# Patient Record
Sex: Female | Born: 1965 | Race: Black or African American | Hispanic: No | Marital: Married | Smoking: Never smoker
Health system: Southern US, Community
[De-identification: ages and names within clinical notes are randomized; demographics above are authoritative.]

## PROBLEM LIST (undated history)

## (undated) DIAGNOSIS — D649 Anemia, unspecified: Secondary | ICD-10-CM

## (undated) DIAGNOSIS — O24419 Gestational diabetes mellitus in pregnancy, unspecified control: Secondary | ICD-10-CM

## (undated) DIAGNOSIS — E785 Hyperlipidemia, unspecified: Secondary | ICD-10-CM

## (undated) DIAGNOSIS — F419 Anxiety disorder, unspecified: Secondary | ICD-10-CM

## (undated) HISTORY — PX: CHOLECYSTECTOMY: SHX55

## (undated) HISTORY — DX: Anxiety disorder, unspecified: F41.9

## (undated) HISTORY — DX: Anemia, unspecified: D64.9

## (undated) HISTORY — PX: OTHER SURGICAL HISTORY: SHX169

---

## 2004-04-16 ENCOUNTER — Ambulatory Visit: Payer: Self-pay | Admitting: Family Medicine

## 2005-01-07 ENCOUNTER — Other Ambulatory Visit: Admission: RE | Admit: 2005-01-07 | Discharge: 2005-01-07 | Payer: Self-pay | Admitting: Family Medicine

## 2007-09-26 ENCOUNTER — Emergency Department (HOSPITAL_COMMUNITY): Admission: EM | Admit: 2007-09-26 | Discharge: 2007-09-26 | Payer: Self-pay | Admitting: Emergency Medicine

## 2009-08-29 ENCOUNTER — Emergency Department (HOSPITAL_COMMUNITY): Admission: EM | Admit: 2009-08-29 | Discharge: 2009-08-29 | Payer: Self-pay | Admitting: Emergency Medicine

## 2009-09-22 ENCOUNTER — Ambulatory Visit (HOSPITAL_COMMUNITY): Admission: RE | Admit: 2009-09-22 | Discharge: 2009-09-22 | Payer: Self-pay | Admitting: Family Medicine

## 2009-10-27 ENCOUNTER — Other Ambulatory Visit: Admission: RE | Admit: 2009-10-27 | Discharge: 2009-10-27 | Payer: Self-pay | Admitting: Family Medicine

## 2010-06-18 LAB — URINE CULTURE: Culture: NO GROWTH

## 2010-06-18 LAB — GLUCOSE, CAPILLARY: Glucose-Capillary: 113 mg/dL — ABNORMAL HIGH (ref 70–99)

## 2010-06-18 LAB — POCT URINALYSIS DIP (DEVICE)
Bilirubin Urine: NEGATIVE
Glucose, UA: NEGATIVE mg/dL
Ketones, ur: NEGATIVE mg/dL

## 2011-07-02 ENCOUNTER — Other Ambulatory Visit: Payer: Self-pay | Admitting: Family Medicine

## 2011-07-02 ENCOUNTER — Ambulatory Visit
Admission: RE | Admit: 2011-07-02 | Discharge: 2011-07-02 | Disposition: A | Discharge status: Home / Self Care | Payer: Federal, State, Local not specified - PPO | Source: Ambulatory Visit | Attending: Family Medicine | Admitting: Family Medicine

## 2011-07-02 DIAGNOSIS — N63 Unspecified lump in unspecified breast: Secondary | ICD-10-CM

## 2015-01-09 LAB — HM PAP SMEAR: HM PAP: NEGATIVE

## 2015-02-04 ENCOUNTER — Other Ambulatory Visit: Payer: Self-pay | Admitting: Internal Medicine

## 2015-02-04 DIAGNOSIS — Z1231 Encounter for screening mammogram for malignant neoplasm of breast: Secondary | ICD-10-CM

## 2015-08-29 DIAGNOSIS — E785 Hyperlipidemia, unspecified: Secondary | ICD-10-CM | POA: Diagnosis not present

## 2015-08-29 DIAGNOSIS — E663 Overweight: Secondary | ICD-10-CM | POA: Diagnosis not present

## 2015-08-29 DIAGNOSIS — D509 Iron deficiency anemia, unspecified: Secondary | ICD-10-CM | POA: Diagnosis not present

## 2015-10-24 DIAGNOSIS — K080 Exfoliation of teeth due to systemic causes: Secondary | ICD-10-CM | POA: Diagnosis not present

## 2015-10-30 DIAGNOSIS — E663 Overweight: Secondary | ICD-10-CM | POA: Diagnosis not present

## 2015-10-30 DIAGNOSIS — E785 Hyperlipidemia, unspecified: Secondary | ICD-10-CM | POA: Diagnosis not present

## 2015-11-21 ENCOUNTER — Emergency Department (HOSPITAL_COMMUNITY)
Admission: EM | Admit: 2015-11-21 | Discharge: 2015-11-22 | Disposition: A | Discharge status: Home / Self Care | Payer: Federal, State, Local not specified - PPO | Attending: Emergency Medicine | Admitting: Emergency Medicine

## 2015-11-21 ENCOUNTER — Emergency Department (HOSPITAL_COMMUNITY): Payer: Federal, State, Local not specified - PPO

## 2015-11-21 ENCOUNTER — Encounter (HOSPITAL_COMMUNITY): Payer: Self-pay | Admitting: Emergency Medicine

## 2015-11-21 DIAGNOSIS — R0789 Other chest pain: Secondary | ICD-10-CM | POA: Insufficient documentation

## 2015-11-21 DIAGNOSIS — R079 Chest pain, unspecified: Secondary | ICD-10-CM

## 2015-11-21 DIAGNOSIS — R0602 Shortness of breath: Secondary | ICD-10-CM | POA: Diagnosis not present

## 2015-11-21 HISTORY — DX: Hyperlipidemia, unspecified: E78.5

## 2015-11-21 HISTORY — DX: Gestational diabetes mellitus in pregnancy, unspecified control: O24.419

## 2015-11-21 LAB — BASIC METABOLIC PANEL
ANION GAP: 9 (ref 5–15)
BUN: 11 mg/dL (ref 6–20)
CALCIUM: 9.4 mg/dL (ref 8.9–10.3)
CO2: 18 mmol/L — ABNORMAL LOW (ref 22–32)
Chloride: 110 mmol/L (ref 101–111)
Creatinine, Ser: 0.88 mg/dL (ref 0.44–1.00)
Glucose, Bld: 128 mg/dL — ABNORMAL HIGH (ref 65–99)
Potassium: 3.1 mmol/L — ABNORMAL LOW (ref 3.5–5.1)
SODIUM: 137 mmol/L (ref 135–145)

## 2015-11-21 LAB — CBC
HCT: 26.6 % — ABNORMAL LOW (ref 36.0–46.0)
HEMOGLOBIN: 7.9 g/dL — AB (ref 12.0–15.0)
MCH: 18.8 pg — AB (ref 26.0–34.0)
MCHC: 29.7 g/dL — AB (ref 30.0–36.0)
MCV: 63.3 fL — AB (ref 78.0–100.0)
PLATELETS: 440 10*3/uL — AB (ref 150–400)
RBC: 4.2 MIL/uL (ref 3.87–5.11)
RDW: 19.4 % — ABNORMAL HIGH (ref 11.5–15.5)
WBC: 7.6 10*3/uL (ref 4.0–10.5)

## 2015-11-21 LAB — I-STAT TROPONIN, ED: TROPONIN I, POC: 0 ng/mL (ref 0.00–0.08)

## 2015-11-21 LAB — D-DIMER, QUANTITATIVE: D-Dimer, Quant: 0.32 ug/mL-FEU (ref 0.00–0.50)

## 2015-11-21 MED ORDER — GI COCKTAIL ~~LOC~~
30.0000 mL | Freq: Once | ORAL | Status: AC
  Administered 2015-11-21: 30 mL via ORAL
  Filled 2015-11-21: qty 30

## 2015-11-21 MED ORDER — POTASSIUM CHLORIDE CRYS ER 20 MEQ PO TBCR
40.0000 meq | EXTENDED_RELEASE_TABLET | Freq: Once | ORAL | Status: AC
  Administered 2015-11-21: 40 meq via ORAL
  Filled 2015-11-21: qty 2

## 2015-11-21 NOTE — ED Provider Notes (Signed)
[facility]-EMER[facility]ENCY DEPT Provider Note   CSN: [id number] Arrival date & time: 11/21/15  2029     History   Chief Complaint Chief Complaint  Patient presents with  . Chest Pain    HPI Judith Garcia is a 50 y.o. female.  50 year old female with a history of hyperlipidemia presents to the emergency department for evaluation of chest pressure and palpitations which began at approximately 1900 this evening. Patient states that she was seated at onset of her symptoms. She reports feeling dyspneic over the past 3 days. She states, "I feel like I can't take a deep breath". This slightly worsened this evening with chest pressure and palpitations. Symptoms unrelieved with 2 nitroglycerin in route. Aspirin also provided no relief. Chest pressure was primarily substernal. She also reports some pressure in her left mid back. She has had no leg swelling, fever, lightheadedness, syncope, nausea, vomiting, or abdominal pain. No recent surgeries or hospitalizations. No hemoptysis. Patient does have a family history of cardiac disease in her sister who passed away at the age of 50. No other known family history. No personal history of DVT/PE or ACS. Patient is a never smoker.     Past Medical History:  Diagnosis Date  . Gestational diabetes   . Hyperlipidemia     There are no active problems to display for this patient.   Past Surgical History:  Procedure Laterality Date  . ceserean    . CHOLECYSTECTOMY      OB History    No data available      Home Medications    Prior to Admission medications   Not on File    Family History No family history on file.  Social History Social History  Substance Use Topics  . Smoking status: Never Smoker  . Smokeless tobacco: Never Used  . Alcohol use No     Allergies   Review of patient's allergies indicates not on file.   Review of Systems Review of Systems  Constitutional: Negative for fever.  Respiratory: Positive for shortness of  breath.   Cardiovascular: Positive for chest pain and palpitations. Negative for leg swelling.  Gastrointestinal: Negative for nausea and vomiting.  Neurological: Negative for syncope and light-headedness.     Physical Exam Updated Vital Signs BP 115/78   Pulse 86   Temp 98.6 F (37 C) (Oral)   Resp 18   Ht 5\' 3"  (1.6 m)   Wt 68.5 kg   LMP 11/21/2015   SpO2 100%   BMI 26.75 kg/m   Physical Exam  Constitutional: She is oriented to person, place, and time. She appears well-developed and well-nourished. No distress.  Nontoxic appearing and in no distress  HENT:  Head: Normocephalic and atraumatic.  Eyes: Conjunctivae and EOM are normal. No scleral icterus.  Neck: Normal range of motion.  Cardiovascular: Normal rate, regular rhythm and intact distal pulses.   Pulmonary/Chest: Effort normal. No respiratory distress. She has no wheezes. She has no rales.  Chest expansion symmetric. Lungs clear to auscultation bilaterally.  Abdominal: Soft. She exhibits no distension. There is no tenderness. There is no guarding.  Soft, nontender abdomen. No masses.  Musculoskeletal: Normal range of motion.  Neurological: She is alert and oriented to person, place, and time.  GCS 15. Patient moving all extremities.  Skin: Skin is warm and dry. No rash noted. She is not diaphoretic. No erythema. No pallor.  Psychiatric: She has a normal mood and affect. Her behavior is normal.  Nursing note and vitals reviewed.  ED Treatments / Results  Labs (all labs ordered are listed, but only abnormal results are displayed) Labs Reviewed  BASIC METABOLIC PANEL - Abnormal; Notable for the following:       Result Value   Potassium 3.1 (*)    CO2 18 (*)    Glucose, Bld 128 (*)    All other components within normal limits  CBC - Abnormal; Notable for the following:    Hemoglobin 7.9 (*)    HCT 26.6 (*)    MCV 63.3 (*)    MCH 18.8 (*)    MCHC 29.7 (*)    RDW 19.4 (*)    Platelets 440 (*)    All  other components within normal limits  D-DIMER, QUANTITATIVE (NOT AT [facility])  I-STAT TROPOININ, ED  Rosezena SensorI-STAT TROPOININ, ED    EKG  EKG Interpretation None       Radiology Dg Chest 2 View  Result Date: 11/21/2015 CLINICAL DATA:  Patient with shortness of breath and tachycardia. EXAM: CHEST  2 VIEW COMPARISON:  None. FINDINGS: Normal cardiac and mediastinal contours. No consolidative pulmonary opacities. No pleural effusion or pneumothorax. IMPRESSION: No acute cardiopulmonary process. Electronically Signed   By: Annia Beltrew  Davis M.D.   On: 11/21/2015 21:38    Procedures Procedures (including critical care time)  Medications Ordered in ED Medications  gi cocktail (Maalox,Lidocaine,Donnatal) (30 mLs Oral Given 11/21/15 2213)  potassium chloride SA (K-DUR,KLOR-CON) CR tablet 40 mEq (40 mEq Oral Given 11/21/15 2308)     Initial Impression / Assessment and Plan / ED Course  I have reviewed the triage vital signs and the nursing notes.  Pertinent labs & imaging results that were available during my care of the patient were reviewed by me and considered in my medical decision making (see chart for details).  Clinical Course    10:15 PM Heart score 3 c/w low risk of ACS. CXR negative for mediastinal widening; doubt dissection. Symptoms also more atypical for this. No evidence of vascular congestion. No hypoxia today. Plan for second troponin at midnight. [facility].  12:50 AM Delta troponin negative. Dimer also negative. Doubt PE. Low suspicion for emergent cardiopulmonary abnormality. Patient feels as though her symptoms improved with PO potassium. She is resting comfortably. VSS. I believe outpatient cardiology and PCP follow up is reasonable. Return precautions discussed and provided. Patient discharged in satisfactory condition with no unaddressed concerns.   Final Clinical Impressions(s) / ED Diagnoses   Final diagnoses:  Chest pain, unspecified chest pain type     Vitals:   11/21/15 2149 11/21/15 2230 11/22/15 0000 11/22/15 0045  BP: 117/70 117/71 115/78 113/79  Pulse: 76 62 86 73  Resp: 18 16 18 20   Temp:      TempSrc:      SpO2: 100% 100% 100% 100%  Weight:      Height:        New Prescriptions New Prescriptions   No medications on file     Antony MaduraKelly Jamiel Goncalves, PA-C 11/22/15 0115    Azalia BilisKevin Campos, MD 11/22/15 [phone]

## 2015-11-21 NOTE — ED Notes (Signed)
Gave pt ice chips, per Katie - RN. 

## 2015-11-21 NOTE — ED Notes (Signed)
Pt back from x-ray.

## 2015-11-21 NOTE — ED Triage Notes (Signed)
Per EMS:  Pt began to experience substernal CP, non-radiating, with SOB.  Pt sts pain improves with positioning.  Pt has a hx of anxiety, states this does not feel the same.  Pt sts hx of SOB x 1 week, especially with exertion.  Pt sts she also feels bloated.  Pt experienced numbness in hands at onset, but pt denies at this time.  Pt has ASA and 2 nitro en route, without improvement.  EMS sts EKG is unremarkable.

## 2015-11-22 LAB — I-STAT TROPONIN, ED: TROPONIN I, POC: 0 ng/mL (ref 0.00–0.08)

## 2015-11-22 NOTE — Discharge Instructions (Signed)
Follow-up with your primary care doctor as well as a cardiologist regarding your visit to the emergency department today. Your workup was reassuring and did not show signs of a heart attack, collapsed lung, fluid around your lung, a blood clot in your lung, or any other concerning process. Return to the Emergency Department if you experience new or concerning symptoms.

## 2015-11-29 DIAGNOSIS — E785 Hyperlipidemia, unspecified: Secondary | ICD-10-CM | POA: Diagnosis not present

## 2015-11-29 DIAGNOSIS — R079 Chest pain, unspecified: Secondary | ICD-10-CM | POA: Diagnosis not present

## 2016-04-30 DIAGNOSIS — K080 Exfoliation of teeth due to systemic causes: Secondary | ICD-10-CM | POA: Diagnosis not present

## 2016-11-12 DIAGNOSIS — K080 Exfoliation of teeth due to systemic causes: Secondary | ICD-10-CM | POA: Diagnosis not present

## 2016-11-30 ENCOUNTER — Ambulatory Visit: Payer: Self-pay | Admitting: Family Medicine

## 2016-12-04 ENCOUNTER — Encounter: Payer: Self-pay | Admitting: Family Medicine

## 2016-12-04 ENCOUNTER — Ambulatory Visit (INDEPENDENT_AMBULATORY_CARE_PROVIDER_SITE_OTHER): Payer: Federal, State, Local not specified - PPO | Admitting: Family Medicine

## 2016-12-04 VITALS — BP 149/84 | HR 88 | Temp 98.7°F | Resp 18 | Ht 62.6 in | Wt 148.2 lb

## 2016-12-04 DIAGNOSIS — Z1211 Encounter for screening for malignant neoplasm of colon: Secondary | ICD-10-CM | POA: Diagnosis not present

## 2016-12-04 DIAGNOSIS — Z1231 Encounter for screening mammogram for malignant neoplasm of breast: Secondary | ICD-10-CM

## 2016-12-04 DIAGNOSIS — Z0000 Encounter for general adult medical examination without abnormal findings: Secondary | ICD-10-CM | POA: Diagnosis not present

## 2016-12-04 MED ORDER — FLUTICASONE PROPIONATE 50 MCG/ACT NA SUSP: 2.0000 | Freq: Every day | NASAL | 6 refills | Status: DC

## 2016-12-04 NOTE — Patient Instructions (Signed)
     IF you received an x-ray today, you will receive an invoice from [facility]. Please contact [facility] at [phone] with questions or concerns regarding your invoice.   IF you received labwork today, you will receive an invoice from [facility]. Please contact [facility] at [phone] with questions or concerns regarding your invoice.   Our billing staff will not be able to assist you with questions regarding bills from these companies.  You will be contacted with the lab results as soon as they are available. The fastest way to get your results is to activate your My Chart account. Instructions are located on the last page of this paperwork. If you have not heard from us regarding the results in 2 weeks, please contact this office.     

## 2016-12-04 NOTE — Progress Notes (Signed)
9/5/20182:12 PM  Judith Garcia Dec 27, 1965, 51 y.o. female [id number]  Chief Complaint  Patient presents with  . Annual Exam    HPI:   Patient is a 51 y.o. female with past medical history significant for hyperlipidemia, anemia and gest ational DM who presents today for annual exam.  Patient reports pap ~ 2016, 2017, normal. Denies h/o abnormal. [id number]. Menses heavy and irregular, told she has fibroids. Reports perimenopasaul symtpoms.  Has not had a mammogram > 2 years, denies abnormal. Denies Fhx of breast or ovarian cancer. Does not have breast concerns today.  Has not had colon cancer screening. Denies fhx of colon cancer.  Has HLP, managed with coq10, red rice yeast, exercise. Has been having some mild muscle pain, left forearm, ulnar side. Wondering if related to her supplements.  Otherwise a bit nervous today. Reports normal BP < 120/80 at home.   Depression screen PHQ 2/9 12/04/2016  Decreased Interest 0  Down, Depressed, Hopeless 0  PHQ - 2 Score 0    No Known Allergies  No current outpatient prescriptions on file prior to visit.   No current facility-administered medications on file prior to visit.     Past Medical History:  Diagnosis Date  . Anemia   . Anxiety   . Gestational diabetes   . Hyperlipidemia     Past Surgical History:  Procedure Laterality Date  . CESAREAN SECTION    . ceserean    . CHOLECYSTECTOMY      Social History  Substance Use Topics  . Smoking status: Never Smoker  . Smokeless tobacco: Never Used  . Alcohol use No    Family History  Problem Relation Age of Onset  . Diabetes Mother   . Diabetes Sister   . Hypertension Sister   . Hyperlipidemia Brother     Review of Systems  Constitutional: Negative for chills and fever.  HENT: Negative for congestion, ear pain and sore throat.   Eyes: Negative for blurred vision and double vision.  Respiratory: Negative for cough and shortness of breath.   Cardiovascular: Negative  for chest pain, palpitations and leg swelling.  Gastrointestinal: Negative for abdominal pain, nausea and vomiting.  Genitourinary: Negative for dysuria and hematuria.  Musculoskeletal: Positive for myalgias.  Neurological: Negative for tingling, sensory change and focal weakness.  Psychiatric/Behavioral: Negative for depression. The patient is nervous/anxious.      OBJECTIVE:  Blood pressure (!) 149/84, pulse 88, temperature 98.7 F (37.1 C), temperature source Oral, resp. rate 18, height 5' 2.6" (1.59 m), weight 148 lb 3.2 oz (67.2 kg), last menstrual period 12/04/2016, SpO2 100 %.  Physical Exam  Constitutional: She is oriented to person, place, and time and well-developed, well-nourished, and in no distress.  HENT:  Head: Normocephalic and atraumatic.  Right Ear: Hearing, tympanic membrane, external ear and ear canal normal.  Left Ear: Hearing, tympanic membrane, external ear and ear canal normal.  Mouth/Throat: Oropharynx is clear and moist.  Eyes: Pupils are equal, round, and reactive to light. EOM are normal.  Neck: Neck supple.  Cardiovascular: Normal rate, regular rhythm and normal heart sounds.  Exam reveals no gallop and no friction rub.   No murmur heard. Pulmonary/Chest: Effort normal and breath sounds normal. She has no wheezes. She has no rales. Right breast exhibits no inverted nipple, no mass, no nipple discharge, no skin change and no tenderness. Left breast exhibits no inverted nipple, no mass, no nipple discharge, no skin change and no tenderness.  Abdominal: Soft. Bowel sounds  are normal. She exhibits no distension and no mass. There is no tenderness.  Musculoskeletal: Normal range of motion. She exhibits no edema.  Lymphadenopathy:    She has no cervical adenopathy.    She has no axillary adenopathy.       Right: No supraclavicular adenopathy present.       Left: No supraclavicular adenopathy present.  Neurological: She is alert and oriented to person, place,  and time. She has normal reflexes. Gait normal.  Skin: Skin is warm and dry.  Nursing note and vitals reviewed.     ASSESSMENT and PLAN:  1. Annual physical exam Routine physical exam done today. Requesting records related to her pap. Ordering routine labs as below. Breast and colon cancer screening per guidelines. BP elevated today, patient reports white coat syndrome, recheck at next visit.  - CBC with Differential - Lipid panel - Comprehensive metabolic panel - TSH - Vitamin D, 25-hydroxy  2. Encounter for screening mammogram for breast cancer See above - MM Digital Screening; Future  3. Colon cancer screening See above - Ambulatory referral to Gastroenterology       Myles Lipps, MD Primary Care at [facility] [address] [place], [place] [address] Ph.  [phone] Fax [phone]

## 2016-12-05 ENCOUNTER — Telehealth: Payer: Self-pay | Admitting: Family Medicine

## 2016-12-05 DIAGNOSIS — D509 Iron deficiency anemia, unspecified: Secondary | ICD-10-CM

## 2016-12-05 LAB — CBC WITH DIFFERENTIAL/PLATELET
Basophils Absolute: 0 10*3/uL (ref 0.0–0.2)
Basos: 1 %
EOS (ABSOLUTE): 0.1 10*3/uL (ref 0.0–0.4)
Eos: 1 %
Hematocrit: 28.1 % — ABNORMAL LOW (ref 34.0–46.6)
Hemoglobin: 8.3 g/dL — ABNORMAL LOW (ref 11.1–15.9)
Immature Grans (Abs): 0 10*3/uL (ref 0.0–0.1)
Immature Granulocytes: 0 %
Lymphocytes Absolute: 1.7 10*3/uL (ref 0.7–3.1)
Lymphs: 32 %
MCH: 18 pg — ABNORMAL LOW (ref 26.6–33.0)
MCHC: 29.5 g/dL — ABNORMAL LOW (ref 31.5–35.7)
MCV: 61 fL — ABNORMAL LOW (ref 79–97)
Monocytes Absolute: 0.3 10*3/uL (ref 0.1–0.9)
Monocytes: 5 %
Neutrophils Absolute: 3.2 10*3/uL (ref 1.4–7.0)
Neutrophils: 61 %
Platelets: 497 10*3/uL — ABNORMAL HIGH (ref 150–379)
RBC: 4.62 x10E6/uL (ref 3.77–5.28)
RDW: 26.1 % — ABNORMAL HIGH (ref 12.3–15.4)
WBC: 5.3 10*3/uL (ref 3.4–10.8)

## 2016-12-05 LAB — COMPREHENSIVE METABOLIC PANEL
ALT: 8 IU/L (ref 0–32)
AST: 16 IU/L (ref 0–40)
Albumin/Globulin Ratio: 1.7 (ref 1.2–2.2)
Albumin: 4.7 g/dL (ref 3.5–5.5)
Alkaline Phosphatase: 64 IU/L (ref 39–117)
BUN/Creatinine Ratio: 12 (ref 9–23)
BUN: 9 mg/dL (ref 6–24)
Bilirubin Total: 0.4 mg/dL (ref 0.0–1.2)
CO2: 17 mmol/L — ABNORMAL LOW (ref 20–29)
Calcium: 9.7 mg/dL (ref 8.7–10.2)
Chloride: 106 mmol/L (ref 96–106)
Creatinine, Ser: 0.78 mg/dL (ref 0.57–1.00)
GFR calc Af Amer: 102 mL/min/{1.73_m2} (ref >59)
GFR calc non Af Amer: 88 mL/min/{1.73_m2} (ref >59)
Globulin, Total: 2.7 g/dL (ref 1.5–4.5)
Glucose: 128 mg/dL — ABNORMAL HIGH (ref 65–99)
Potassium: 4.4 mmol/L (ref 3.5–5.2)
Sodium: 139 mmol/L (ref 134–144)
Total Protein: 7.4 g/dL (ref 6.0–8.5)

## 2016-12-05 LAB — LIPID PANEL
Chol/HDL Ratio: 4.9 ratio — ABNORMAL HIGH (ref 0.0–4.4)
Cholesterol, Total: 238 mg/dL — ABNORMAL HIGH (ref 100–199)
HDL: 49 mg/dL (ref >39)
LDL Calculated: 169 mg/dL — ABNORMAL HIGH (ref 0–99)
Triglycerides: 99 mg/dL (ref 0–149)
VLDL Cholesterol Cal: 20 mg/dL (ref 5–40)

## 2016-12-05 LAB — TSH: TSH: 2.35 u[IU]/mL (ref 0.450–4.500)

## 2016-12-05 LAB — VITAMIN D 25 HYDROXY (VIT D DEFICIENCY, FRACTURES): Vit D, 25-Hydroxy: 28.2 ng/mL — ABNORMAL LOW (ref 30.0–100.0)

## 2016-12-05 NOTE — Telephone Encounter (Signed)
Discussed lab results with patient. She reports long standing anemia. She denies ever having had an EGD or testing for celiac disease. She has been intolerant of iron supplements in the past due to constipation. Discussed taking a daily pre-natal vitamin, OTC. Keep appt for colonoscopy. Iron loss might be related to heavy menses due to "fibroids". Records not received yet. FU 4 weeks, labs 2-3 days before appt.

## 2016-12-25 DIAGNOSIS — R1013 Epigastric pain: Secondary | ICD-10-CM | POA: Diagnosis not present

## 2016-12-25 DIAGNOSIS — D509 Iron deficiency anemia, unspecified: Secondary | ICD-10-CM | POA: Diagnosis not present

## 2017-01-24 ENCOUNTER — Encounter: Payer: Self-pay | Admitting: Family Medicine

## 2017-01-24 DIAGNOSIS — D500 Iron deficiency anemia secondary to blood loss (chronic): Secondary | ICD-10-CM | POA: Diagnosis not present

## 2017-01-24 DIAGNOSIS — K2970 Gastritis, unspecified, without bleeding: Secondary | ICD-10-CM | POA: Diagnosis not present

## 2017-01-24 DIAGNOSIS — K2930 Chronic superficial gastritis without bleeding: Secondary | ICD-10-CM | POA: Diagnosis not present

## 2017-01-24 DIAGNOSIS — K640 First degree hemorrhoids: Secondary | ICD-10-CM | POA: Diagnosis not present

## 2017-01-24 DIAGNOSIS — K635 Polyp of colon: Secondary | ICD-10-CM | POA: Diagnosis not present

## 2017-01-24 LAB — HM COLONOSCOPY

## 2017-01-30 DIAGNOSIS — K2930 Chronic superficial gastritis without bleeding: Secondary | ICD-10-CM | POA: Diagnosis not present

## 2017-01-30 DIAGNOSIS — K635 Polyp of colon: Secondary | ICD-10-CM | POA: Diagnosis not present

## 2017-03-04 DIAGNOSIS — D500 Iron deficiency anemia secondary to blood loss (chronic): Secondary | ICD-10-CM | POA: Diagnosis not present

## 2017-03-27 ENCOUNTER — Ambulatory Visit
Admission: RE | Admit: 2017-03-27 | Discharge: 2017-03-27 | Disposition: A | Discharge status: Home / Self Care | Payer: Federal, State, Local not specified - PPO | Source: Ambulatory Visit | Attending: Family Medicine | Admitting: Family Medicine

## 2017-03-27 DIAGNOSIS — Z1231 Encounter for screening mammogram for malignant neoplasm of breast: Secondary | ICD-10-CM | POA: Diagnosis not present

## 2017-03-28 ENCOUNTER — Other Ambulatory Visit: Payer: Self-pay | Admitting: Family Medicine

## 2017-03-28 DIAGNOSIS — R928 Other abnormal and inconclusive findings on diagnostic imaging of breast: Secondary | ICD-10-CM

## 2017-04-21 ENCOUNTER — Ambulatory Visit
Admission: RE | Admit: 2017-04-21 | Discharge: 2017-04-21 | Disposition: A | Discharge status: Home / Self Care | Payer: Federal, State, Local not specified - PPO | Source: Ambulatory Visit | Attending: Family Medicine | Admitting: Family Medicine

## 2017-04-21 DIAGNOSIS — R921 Mammographic calcification found on diagnostic imaging of breast: Secondary | ICD-10-CM | POA: Diagnosis not present

## 2017-04-21 DIAGNOSIS — R928 Other abnormal and inconclusive findings on diagnostic imaging of breast: Secondary | ICD-10-CM

## 2017-05-19 DIAGNOSIS — K080 Exfoliation of teeth due to systemic causes: Secondary | ICD-10-CM | POA: Diagnosis not present

## 2017-06-17 ENCOUNTER — Ambulatory Visit: Payer: Federal, State, Local not specified - PPO | Admitting: Family Medicine

## 2017-06-24 ENCOUNTER — Ambulatory Visit: Payer: Federal, State, Local not specified - PPO | Admitting: Family Medicine

## 2017-07-01 ENCOUNTER — Ambulatory Visit: Payer: Federal, State, Local not specified - PPO | Admitting: Family Medicine

## 2017-07-01 ENCOUNTER — Encounter: Payer: Self-pay | Admitting: Family Medicine

## 2017-07-01 ENCOUNTER — Other Ambulatory Visit: Payer: Self-pay

## 2017-07-01 VITALS — BP 142/76 | HR 89 | Temp 98.0°F | Ht 64.0 in | Wt 151.4 lb

## 2017-07-01 DIAGNOSIS — Z8249 Family history of ischemic heart disease and other diseases of the circulatory system: Secondary | ICD-10-CM | POA: Diagnosis not present

## 2017-07-01 DIAGNOSIS — R079 Chest pain, unspecified: Secondary | ICD-10-CM | POA: Diagnosis not present

## 2017-07-01 NOTE — Progress Notes (Signed)
4/2/201910:55 AM  Judith Garcia 1965/05/31, 52 y.o. female [id number]  Chief Complaint  Patient presents with  . Chest Pain    Has been having chest pain for a while radiating to the left arm. Was told that her potassium is low.     HPI:   Patient is a 52 y.o. female who presents today for intermittent chest pain.  She reports for past several weeks of having intermittent random chest pain, left sided, pressure. Sometimes radiates to her upper left back and down her left arm with numbness and tingling. She states it happens at random. Yesterday it happened when she was resting, in the evening, got better by the time she went to sleep. No chest pain today. However several days ago had it while she was raking.   She states sometimes worse with deep breathing. Denies any heartburn. Denies any SOB, nausea, palpitations, diaphoresis. She does have an intermittent mild dry cough.   Patient does not smoke, she reports normal BP at home, LDL 168 in Sept  Her sister died of CAD in her 7540s  Depression screen PHQ 2/9 07/01/2017 12/04/2016  Decreased Interest 0 0  Down, Depressed, Hopeless 0 0  PHQ - 2 Score 0 0    No Known Allergies  Prior to Admission medications   Medication Sig Start Date End Date Taking? Authorizing Provider  ELDERBERRY PO Take by mouth.    [provider]  fluticasone (FLONASE) 50 MCG/ACT nasal spray Place 2 sprays into both nostrils daily. Patient not taking: Reported on 07/01/2017 12/04/16   Myles LippsSantiago, Daveyon Kitchings M, MD  Multiple Vitamin (MULTIVITAMIN WITH MINERALS) TABS tablet Take 1 tablet by mouth daily.    [provider]  Vitamin D, Ergocalciferol, (DRISDOL) 50000 units CAPS capsule Take 50,000 Units by mouth every 7 (seven) days.    [provider]    Past Medical History:  Diagnosis Date  . Anemia   . Anxiety   . Gestational diabetes   . Hyperlipidemia     Past Surgical History:  Procedure Laterality Date  . CESAREAN SECTION    .  ceserean    . CHOLECYSTECTOMY      Social History   Tobacco Use  . Smoking status: Never Smoker  . Smokeless tobacco: Never Used  Substance Use Topics  . Alcohol use: No    Family History  Problem Relation Age of Onset  . Diabetes Mother   . Diabetes Sister   . Hypertension Sister   . Hyperlipidemia Brother     ROS Per hpi  OBJECTIVE:  Blood pressure (!) 142/76, pulse 89, temperature 98 F (36.7 C), temperature source Oral, height 5\' 4"  (1.626 m), weight 151 lb 6.4 oz (68.7 kg), last menstrual period 06/14/2017, SpO2 100 %.  Physical Exam  Gen: AAOx3, NAD HEENT: EOMI, PERRLA, OP clear, MMM Neck: supple, no carotid bruits appreciated CV: RRR, no m/r/g Chest: minimal TTP over costochondral joints Pulm: CTAB, no w/r/r Ext: no edema  EKG - NSR, HR 72, no st changes, normal intervals  ASSESSMENT and PLAN  1. Chest pain, unspecified type Normal ekg and exam today in clinic. Referring to cards to further risk stratify given her fhx of early CAD. RTC precautions given.   - EKG 12-Lead - CBC with Differential - Comprehensive metabolic panel - Ambulatory referral to Cardiology  2. Family history of early CAD - Ambulatory referral to Cardiology  Return for after cards.    Myles LippsIrma M Santiago, MD [facility] at [facility] [address] [place], [place] [address] Ph.  [phone] Fax [phone]   

## 2017-07-01 NOTE — Progress Notes (Signed)
ff

## 2017-07-01 NOTE — Patient Instructions (Signed)
     IF you received an x-ray today, you will receive an invoice from [facility]. Please contact [facility] at [phone] with questions or concerns regarding your invoice.   IF you received labwork today, you will receive an invoice from [facility]. Please contact [facility] at [phone] with questions or concerns regarding your invoice.   Our billing staff will not be able to assist you with questions regarding bills from these companies.  You will be contacted with the lab results as soon as they are available. The fastest way to get your results is to activate your My Chart account. Instructions are located on the last page of this paperwork. If you have not heard from us regarding the results in 2 weeks, please contact this office.     

## 2017-07-02 LAB — CBC WITH DIFFERENTIAL/PLATELET
Basophils Absolute: 0 10*3/uL (ref 0.0–0.2)
Basos: 0 %
EOS (ABSOLUTE): 0 10*3/uL (ref 0.0–0.4)
Eos: 1 %
Hematocrit: 35.6 % (ref 34.0–46.6)
Hemoglobin: 11.1 g/dL (ref 11.1–15.9)
Immature Grans (Abs): 0 10*3/uL (ref 0.0–0.1)
Immature Granulocytes: 0 %
Lymphocytes Absolute: 1.8 10*3/uL (ref 0.7–3.1)
Lymphs: 35 %
MCH: 23.3 pg — ABNORMAL LOW (ref 26.6–33.0)
MCHC: 31.2 g/dL — ABNORMAL LOW (ref 31.5–35.7)
MCV: 75 fL — ABNORMAL LOW (ref 79–97)
Monocytes Absolute: 0.3 10*3/uL (ref 0.1–0.9)
Monocytes: 6 %
Neutrophils Absolute: 2.9 10*3/uL (ref 1.4–7.0)
Neutrophils: 58 %
Platelets: 416 10*3/uL — ABNORMAL HIGH (ref 150–379)
RBC: 4.76 x10E6/uL (ref 3.77–5.28)
RDW: 17.2 % — ABNORMAL HIGH (ref 12.3–15.4)
WBC: 5.1 10*3/uL (ref 3.4–10.8)

## 2017-07-02 LAB — COMPREHENSIVE METABOLIC PANEL
ALT: 9 IU/L (ref 0–32)
AST: 14 IU/L (ref 0–40)
Albumin/Globulin Ratio: 1.8 (ref 1.2–2.2)
Albumin: 4.6 g/dL (ref 3.5–5.5)
Alkaline Phosphatase: 69 IU/L (ref 39–117)
BUN/Creatinine Ratio: 10 (ref 9–23)
BUN: 8 mg/dL (ref 6–24)
Bilirubin Total: 0.2 mg/dL (ref 0.0–1.2)
CO2: 18 mmol/L — ABNORMAL LOW (ref 20–29)
Calcium: 9.8 mg/dL (ref 8.7–10.2)
Chloride: 105 mmol/L (ref 96–106)
Creatinine, Ser: 0.82 mg/dL (ref 0.57–1.00)
GFR calc Af Amer: 96 mL/min/{1.73_m2} (ref >59)
GFR calc non Af Amer: 83 mL/min/{1.73_m2} (ref >59)
Globulin, Total: 2.5 g/dL (ref 1.5–4.5)
Glucose: 124 mg/dL — ABNORMAL HIGH (ref 65–99)
Potassium: 4.5 mmol/L (ref 3.5–5.2)
Sodium: 139 mmol/L (ref 134–144)
Total Protein: 7.1 g/dL (ref 6.0–8.5)

## 2017-07-21 ENCOUNTER — Encounter: Payer: Self-pay | Admitting: *Deleted

## 2017-10-09 ENCOUNTER — Encounter

## 2017-10-09 ENCOUNTER — Ambulatory Visit: Payer: Federal, State, Local not specified - PPO | Admitting: Family Medicine

## 2017-11-17 DIAGNOSIS — K080 Exfoliation of teeth due to systemic causes: Secondary | ICD-10-CM | POA: Diagnosis not present

## 2018-01-15 ENCOUNTER — Encounter: Payer: Self-pay | Admitting: Family Medicine

## 2018-01-15 ENCOUNTER — Ambulatory Visit: Payer: Federal, State, Local not specified - PPO | Admitting: Family Medicine

## 2018-01-15 ENCOUNTER — Encounter (INDEPENDENT_AMBULATORY_CARE_PROVIDER_SITE_OTHER): Payer: Self-pay

## 2018-01-15 VITALS — BP 144/90 | HR 93 | Temp 98.3°F | Ht 62.5 in | Wt 161.5 lb

## 2018-01-15 DIAGNOSIS — Z6828 Body mass index (BMI) 28.0-28.9, adult: Secondary | ICD-10-CM | POA: Insufficient documentation

## 2018-01-15 DIAGNOSIS — R079 Chest pain, unspecified: Secondary | ICD-10-CM

## 2018-01-15 DIAGNOSIS — N921 Excessive and frequent menstruation with irregular cycle: Secondary | ICD-10-CM

## 2018-01-15 DIAGNOSIS — E7800 Pure hypercholesterolemia, unspecified: Secondary | ICD-10-CM | POA: Diagnosis not present

## 2018-01-15 DIAGNOSIS — R053 Chronic cough: Secondary | ICD-10-CM | POA: Insufficient documentation

## 2018-01-15 DIAGNOSIS — R030 Elevated blood-pressure reading, without diagnosis of hypertension: Secondary | ICD-10-CM | POA: Diagnosis not present

## 2018-01-15 DIAGNOSIS — R7303 Prediabetes: Secondary | ICD-10-CM | POA: Diagnosis not present

## 2018-01-15 DIAGNOSIS — E785 Hyperlipidemia, unspecified: Secondary | ICD-10-CM | POA: Insufficient documentation

## 2018-01-15 DIAGNOSIS — E1159 Type 2 diabetes mellitus with other circulatory complications: Secondary | ICD-10-CM | POA: Insufficient documentation

## 2018-01-15 DIAGNOSIS — Z6829 Body mass index (BMI) 29.0-29.9, adult: Secondary | ICD-10-CM

## 2018-01-15 DIAGNOSIS — D509 Iron deficiency anemia, unspecified: Secondary | ICD-10-CM | POA: Diagnosis not present

## 2018-01-15 DIAGNOSIS — Z8349 Family history of other endocrine, nutritional and metabolic diseases: Secondary | ICD-10-CM | POA: Diagnosis not present

## 2018-01-15 DIAGNOSIS — R05 Cough: Secondary | ICD-10-CM

## 2018-01-15 DIAGNOSIS — E119 Type 2 diabetes mellitus without complications: Secondary | ICD-10-CM | POA: Insufficient documentation

## 2018-01-15 DIAGNOSIS — I152 Hypertension secondary to endocrine disorders: Secondary | ICD-10-CM | POA: Insufficient documentation

## 2018-01-15 DIAGNOSIS — E1169 Type 2 diabetes mellitus with other specified complication: Secondary | ICD-10-CM | POA: Insufficient documentation

## 2018-01-15 LAB — COMPREHENSIVE METABOLIC PANEL
ALBUMIN: 4.6 g/dL (ref 3.5–5.2)
ALK PHOS: 83 U/L (ref 39–117)
ALT: 14 U/L (ref 0–35)
AST: 13 U/L (ref 0–37)
BILIRUBIN TOTAL: 0.4 mg/dL (ref 0.2–1.2)
BUN: 8 mg/dL (ref 6–23)
CO2: 25 mEq/L (ref 19–32)
CREATININE: 0.75 mg/dL (ref 0.40–1.20)
Calcium: 9.9 mg/dL (ref 8.4–10.5)
Chloride: 102 mEq/L (ref 96–112)
GFR: 104.19 mL/min (ref >60.00)
GLUCOSE: 157 mg/dL — AB (ref 70–99)
Potassium: 3.9 mEq/L (ref 3.5–5.1)
SODIUM: 138 meq/L (ref 135–145)
Total Protein: 7.6 g/dL (ref 6.0–8.3)

## 2018-01-15 LAB — CBC WITH DIFFERENTIAL/PLATELET
Basophils Absolute: 0 10*3/uL (ref 0.0–0.1)
Basophils Relative: 0.5 % (ref 0.0–3.0)
EOS ABS: 0.1 10*3/uL (ref 0.0–0.7)
EOS PCT: 0.8 % (ref 0.0–5.0)
HEMATOCRIT: 38.7 % (ref 36.0–46.0)
HEMOGLOBIN: 12.1 g/dL (ref 12.0–15.0)
LYMPHS PCT: 31.8 % (ref 12.0–46.0)
Lymphs Abs: 2 10*3/uL (ref 0.7–4.0)
MCHC: 31.3 g/dL (ref 30.0–36.0)
MCV: 69.8 fl — ABNORMAL LOW (ref 78.0–100.0)
Monocytes Absolute: 0.4 10*3/uL (ref 0.1–1.0)
Monocytes Relative: 6.9 % (ref 3.0–12.0)
Neutro Abs: 3.8 10*3/uL (ref 1.4–7.7)
Neutrophils Relative %: 60 % (ref 43.0–77.0)
Platelets: 365 10*3/uL (ref 150.0–400.0)
RBC: 5.54 Mil/uL — AB (ref 3.87–5.11)
RDW: 21.9 % — ABNORMAL HIGH (ref 11.5–15.5)
WBC: 6.4 10*3/uL (ref 4.0–10.5)

## 2018-01-15 LAB — T4, FREE: Free T4: 0.8 ng/dL (ref 0.60–1.60)

## 2018-01-15 LAB — HEMOGLOBIN A1C: HEMOGLOBIN A1C: 7.1 % — AB (ref 4.6–6.5)

## 2018-01-15 LAB — LIPID PANEL
CHOLESTEROL: 298 mg/dL — AB (ref 0–200)
HDL: 53.8 mg/dL (ref >39.00)
LDL Cholesterol: 208 mg/dL — ABNORMAL HIGH (ref 0–99)
NONHDL: 244.11
Total CHOL/HDL Ratio: 6
Triglycerides: 179 mg/dL — ABNORMAL HIGH (ref 0.0–149.0)
VLDL: 35.8 mg/dL (ref 0.0–40.0)

## 2018-01-15 LAB — IBC PANEL
IRON: 26 ug/dL — AB (ref 42–145)
SATURATION RATIOS: 5.1 % — AB (ref 20.0–50.0)
Transferrin: 363 mg/dL — ABNORMAL HIGH (ref 212.0–360.0)

## 2018-01-15 LAB — T3, FREE: T3 FREE: 3.4 pg/mL (ref 2.3–4.2)

## 2018-01-15 LAB — FERRITIN: FERRITIN: 9.9 ng/mL — AB (ref 10.0–291.0)

## 2018-01-15 LAB — TSH: TSH: 1.36 u[IU]/mL (ref 0.35–4.50)

## 2018-01-15 NOTE — Assessment & Plan Note (Signed)
LDL 169 in past. Due for re-eval.

## 2018-01-15 NOTE — Progress Notes (Signed)
Subjective:    Patient ID: Judith Garcia, female    DOB: 1965-07-13, 52 y.o.   MRN: [id number]  HPI 52 year old female presents to establish care.  She has been seeing Dr. Leretha Pol.. Last CPX was  11/2016.   High cholesterol  Due for re-eval. Lab Results  Component Value Date   CHOL 238 (H) 12/04/2016   HDL 49 12/04/2016   LDLCALC 169 (H) 12/04/2016   TRIG 99 12/04/2016   CHOLHDL 4.9 (H) 12/04/2016    Hx of iron defic anemia... Hg 8.3.Marland KitchenMarland Kitchen Last check in 4 /2019 11 on prenatal supplement with iron.  Has heavy menses.. Possibly due to fibroids. Last menses 7 to 10/2017.Marland Kitchen Several times. Colonoscopy 2018.   She was seen in 06/2017 for chest pain... At rest, central weight on chest,  Worse with exertion. Occurring once or twice  A day.  Associated with SOB, fatigue  Nml EKG in 06/2017  Continues to have some chest pain.. Associated with persistent cough... Constant mucus.. white foam. Worse at night.   No sneeze, has post nasal drip.. Had SE to flonase.  Worse at night laying down. No GERD Nml endoscopy 2018   Today BP is high.. She does get worried at MD.. May be whtie coat HTN  BP Readings from Last 3 Encounters:  01/15/18 (!) 144/90  07/01/17 (!) 142/76  12/04/16 (!) 149/84  Body mass index is 29.07 kg/m.    Social History /Family History/Past Medical History reviewed in detail and updated in EMR if needed. Blood pressure (!) 144/90, pulse 93, temperature 98.3 F (36.8 C), temperature source Oral, height 5' 2.5" (1.588 m), weight 161 lb 8 oz (73.3 kg), last menstrual period 06/14/2017.  Review of Systems  Constitutional: Negative for fatigue and fever.  HENT: Negative for congestion.   Eyes: Negative for pain.  Respiratory: Negative for cough and shortness of breath.   Cardiovascular: Positive for chest pain. Negative for palpitations and leg swelling.  Gastrointestinal: Negative for abdominal pain.  Genitourinary: Negative for dysuria and vaginal bleeding.    Musculoskeletal: Negative for back pain.  Neurological: Negative for syncope, light-headedness and headaches.  Psychiatric/Behavioral: Negative for dysphoric mood.       Objective:   Physical Exam  Constitutional: Vital signs are normal. She appears well-developed and well-nourished. She is cooperative.  Non-toxic appearance. She does not appear ill. No distress.  HENT:  Head: Normocephalic.  Right Ear: Hearing, tympanic membrane, external ear and ear canal normal. Tympanic membrane is not erythematous, not retracted and not bulging.  Left Ear: Hearing, tympanic membrane, external ear and ear canal normal. Tympanic membrane is not erythematous, not retracted and not bulging.  Nose: Nose normal. No mucosal edema or rhinorrhea. Right sinus exhibits no maxillary sinus tenderness and no frontal sinus tenderness. Left sinus exhibits no maxillary sinus tenderness and no frontal sinus tenderness.  Mouth/Throat: Uvula is midline, oropharynx is clear and moist and mucous membranes are normal.  Eyes: Pupils are equal, round, and reactive to light. Conjunctivae, EOM and lids are normal. Lids are everted and swept, no foreign bodies found.  Neck: Trachea normal and normal range of motion. Neck supple. Carotid bruit is not present. No thyroid mass and no thyromegaly present.  Cardiovascular: Normal rate, regular rhythm, S1 normal, S2 normal, normal heart sounds, intact distal pulses and normal pulses. Exam reveals no gallop and no friction rub.  No murmur heard. Pulmonary/Chest: Effort normal and breath sounds normal. No tachypnea. No respiratory distress. She has no decreased breath  sounds. She has no wheezes. She has no rhonchi. She has no rales.  Abdominal: Soft. Normal appearance and bowel sounds are normal. She exhibits no distension, no fluid wave, no abdominal bruit and no mass. There is no hepatosplenomegaly. There is no tenderness. There is no rebound, no guarding and no CVA tenderness. No hernia.   Lymphadenopathy:    She has no cervical adenopathy.    She has no axillary adenopathy.  Neurological: She is alert. She has normal strength. No cranial nerve deficit or sensory deficit.  Skin: Skin is warm, dry and intact. No rash noted.  Psychiatric: Her speech is normal and behavior is normal. Judgment and thought content normal. Her mood appears not anxious. Cognition and memory are normal. She does not exhibit a depressed mood.          Assessment & Plan:

## 2018-01-15 NOTE — Assessment & Plan Note (Signed)
Likely due to premenopause.

## 2018-01-15 NOTE — Patient Instructions (Addendum)
Continue prenatal vitamin for iron. Follow blood pressure at home,.. Call in next  1-2 weeks with BP results.  Please stop at the front desk to set up referral.  Please stop at the lab to have labs drawn.

## 2018-01-15 NOTE — Assessment & Plan Note (Addendum)
Resolved at last check.. Likely due to heavy menses. Continue prenatal vitmain.

## 2018-01-15 NOTE — Assessment & Plan Note (Addendum)
Follow blood pressure at home,.. Call in next  1-2 weeks with BP results.

## 2018-01-21 ENCOUNTER — Telehealth: Payer: Self-pay | Admitting: Family Medicine

## 2018-01-21 NOTE — Telephone Encounter (Signed)
Patient called in and asks for her iron level. I advised what the level is, 26 L. She asks for the hemoglobin, I advised normal at 12.1. She asks if it's normal why does she have to take an iron pill and no one has ever told her about iron level. I advised the hemoglobin is used by providers to determine being anemic and with her hemoglobin being low in the past, it was her providers preference to dig a little deeper and order iron/anemia panel, which shows the iron stores in the blood. I advised that both iron and hemoglobin work together and if one is low, that tends to keep the other low. She says she doesn't understand why it was not explained to her and why she would be told to start on a medication and not asked if she's used it before. She says she's been on ferrous sulfate and it upset her stomach so bad, so she stopped taking it and is taking a prenatal vitamin. She asks is there anything natural she can do to bring the iron up without medication, I advised iron rich foods. She says she's done research on all of that and understand about the foods. I advised there are other iron pills that can be prescribed that may not upset her stomach as much and would she like to be prescribed something else. She says she will just take the prenatal vitamin and talk more about it when she comes back in November for her appointment with Dr. Ermalene Searing.

## 2018-01-23 NOTE — Telephone Encounter (Signed)
Per patient's phone call she states she will talk more about it at her appointment with Dr. Ermalene Searing in November.  Return call not needed.

## 2018-01-23 NOTE — Telephone Encounter (Signed)
Let ot know I can discuss all the labs/her questions with her in detail at the upcoming OV in November.

## 2018-02-01 DIAGNOSIS — R079 Chest pain, unspecified: Secondary | ICD-10-CM | POA: Insufficient documentation

## 2018-02-01 NOTE — Progress Notes (Signed)
Cardiology Office Note  Date:  02/02/2018   ID:  Judith Garcia, DOB 1966-03-27, MRN [id number]  PCP:  Excell Seltzer, MD   Chief Complaint  Patient presents with  . other    Chest pain would like to discuss stress test. Meds reviewed verbally with pt.    HPI:  Judith Garcia is a 52 yo woman with PMH of  Hyperlipidemia, LDL 208, total chol 300 Low iron Family hx of premature CAD/sister Referred by Dr. Ermalene Searing for consultation of her chest pain  Reports having chest pain dating back one year Went for colonoscopy 2018, was having symptoms at that time Sometimes has chest pain at rest, other times with exertion Uncertain if this is associated with stress Try to do exercise program, yoga, treadmill Occasionally has chest pains  Previously seen by primary care April 2019, reported having chest pain at that time "random chest pain, Sometimes radiates to her upper left back and down her left arm with numbness and tingling. "  Was told by primary care she would need a stress test  Reports having family history, sister died in her 63s, " something to do with her heart" Details unclear  EKG personally reviewed by myself on todays visit Shows normal sinus rhythm with rate 85 bpm no significant ST or T wave changes   PMH:   has a past medical history of Anemia, Anxiety, Gestational diabetes, and Hyperlipidemia.  PSH:    Past Surgical History:  Procedure Laterality Date  . CESAREAN SECTION    . ceserean    . CHOLECYSTECTOMY      Current Outpatient Medications  Medication Sig Dispense Refill  . ELDERBERRY PO Take by mouth as needed.     . Flaxseed, Linseed, (FLAX SEEDS PO) Take 1 tablet by mouth daily as needed.    Marland Kitchen GARLIC PO Take 1 tablet by mouth daily as needed.    . Multiple Vitamin (MULTIVITAMIN WITH MINERALS) TABS tablet Take 1 tablet by mouth daily.    . Probiotic Product (PROBIOTIC PO) Take by mouth daily.     No current facility-administered medications for  this visit.      Allergies:   Patient has no known allergies.   Social History:  The patient  reports that she has never smoked. She has never used smokeless tobacco. She reports that she does not drink alcohol or use drugs.   Family History:   family history includes Alcohol abuse in her father; Diabetes in her mother; Heart disease in her sister.    Review of Systems: Review of Systems  Constitutional: Negative.   Respiratory: Negative.   Cardiovascular: Negative.   Gastrointestinal: Negative.   Musculoskeletal: Negative.   Neurological: Negative.   Psychiatric/Behavioral: Negative.   All other systems reviewed and are negative.    PHYSICAL EXAM: VS:  BP 122/88 (BP Location: Right Arm, Patient Position: Sitting, Cuff Size: Normal)   Pulse 85   Ht 5' 2.5" (1.588 m)   Wt 158 lb 8 oz (71.9 kg)   LMP 06/14/2017   BMI 28.53 kg/m  , BMI Body mass index is 28.53 kg/m. GEN: Well nourished, well developed, in no acute distress  HEENT: normal  Neck: no JVD, carotid bruits, or masses Cardiac: RRR; no murmurs, rubs, or gallops,no edema  Respiratory:  clear to auscultation bilaterally, normal work of breathing GI: soft, nontender, nondistended, + BS MS: no deformity or atrophy  Skin: warm and dry, no rash Neuro:  Strength and sensation are intact Psych:  euthymic mood, full affect   Recent Labs: 01/15/2018: ALT 14; BUN 8; Creatinine, Ser 0.75; Hemoglobin 12.1; Platelets 365.0; Potassium 3.9; Sodium 138; TSH 1.36    Lipid Panel Lab Results  Component Value Date   CHOL 298 (H) 01/15/2018   HDL 53.80 01/15/2018   LDLCALC 208 (H) 01/15/2018   TRIG 179.0 (H) 01/15/2018      Wt Readings from Last 3 Encounters:  02/02/18 158 lb 8 oz (71.9 kg)  01/15/18 161 lb 8 oz (73.3 kg)  07/01/17 151 lb 6.4 oz (68.7 kg)    ASSESSMENT AND PLAN:  High cholesterol -  Markedly elevated cholesterol numbers She has indicated she does not want medication at this time Recommend we do  some screening studies to determine if she is at high risk. CT coronary calcium score ordered If score is markedly elevated, would consider options to treat her cholesterol -She will work on her diet and exercise  Chest pain with moderate risk for cardiac etiology -  Typical and atypical features Strong family history of heart disease, sister who passed in her early 62s Spent long time in the office today discussing various types of testing including stress Myoview, treadmill alone, treadmill stress echo and even CT coronary calcium scoring alone After further discussion, we have recommended  stress echocardiogram   Prediabetes We have encouraged continued exercise, careful diet management in an effort to lose weight.  Disposition:   F/U as needed We will call her with the results  Patient seen in consultation for Dr. Ermalene Searing and will be referred back to her office for ongoing care of the issues detailed above   Total encounter time more than 60 minutes  Greater than 50% was spent in counseling and coordination of care with the patient    Orders Placed This Encounter  Procedures  . CT CARDIAC SCORING  . EKG 12-Lead  . ECHOCARDIOGRAM STRESS TEST     Signed, Dossie Arbour, M.D., Ph.D. 02/02/2018  [facility] [place], [place] [phone]

## 2018-02-02 ENCOUNTER — Encounter: Payer: Self-pay | Admitting: Cardiovascular Disease

## 2018-02-02 ENCOUNTER — Ambulatory Visit: Payer: Federal, State, Local not specified - PPO | Admitting: Cardiovascular Disease

## 2018-02-02 VITALS — BP 122/88 | HR 85 | Ht 62.5 in | Wt 158.5 lb

## 2018-02-02 DIAGNOSIS — R079 Chest pain, unspecified: Secondary | ICD-10-CM | POA: Diagnosis not present

## 2018-02-02 DIAGNOSIS — R7303 Prediabetes: Secondary | ICD-10-CM | POA: Diagnosis not present

## 2018-02-02 DIAGNOSIS — E7800 Pure hypercholesterolemia, unspecified: Secondary | ICD-10-CM

## 2018-02-02 NOTE — Patient Instructions (Addendum)
Medication Instructions:  No changes  If you need a refill on your cardiac medications before your next appointment, please call your pharmacy.    Lab work: No new labs needed   If you have labs (blood work) drawn today and your tests are completely normal, you will receive your results only by: Marland Kitchen MyChart Message (if you have MyChart) OR . A paper copy in the mail If you have any lab test that is abnormal or we need to change your treatment, we will call you to review the results.   Testing/Procedures: We will order a treadmill echocardiogram stress test for chest pain/angina  - you may eat a light breakfast/ lunch prior to your procedure - no caffeine for 24 hours prior to your test (coffee, tea, soft drinks, or chocolate)  - no smoking/ vaping for 4 hours prior to your test - you may take your regular medications the day of your test  - bring any inhalers with you to your test - wear comfortable clothing & tennis/ non-skid shoes to walk on the treadmill   CT coronary calcium score, family hx , chest pain $150 out of pocket - call [phone] to schedule  - [facility] [address] [place], [place] [address]   Follow-Up: At [facility], you and your health needs are our priority.  As part of our continuing mission to provide you with exceptional heart care, we have created designated Provider Care Teams.  These Care Teams include your primary Cardiologist (physician) and Advanced Practice Providers (APPs -  Physician Assistants and Nurse Practitioners) who all work together to provide you with the care you need, when you need it.  . You will need a follow up appointment as needed   . Providers on your designated Care Team:   . Nicolasa Ducking, NP . Eula Listen, PA-C . Marisue Ivan, PA-C  Any Other Special Instructions Will Be Listed Below (If Applicable).  For educational health videos Log in to : [email or web address] Or : [email or web address], password  : [email or web address]   Exercise Stress Echocardiogram An exercise stress echocardiogram is a test that checks how well your heart is working. For this test, you will walk on a treadmill to make your heart beat faster. This test uses sound waves (ultrasound) and a computer to make pictures (images) of your heart. These pictures will be taken before you exercise and after you exercise. What happens before the procedure?  Follow instructions from your doctor about what you cannot eat or drink before the test.  Do not drink or eat anything that has caffeine in it. Stop having caffeine for 24 hours before the test.  Ask your doctor about changing or stopping your normal medicines. This is important if you take diabetes medicines or blood thinners. Ask your doctor if you should take your medicines with water before the test.  If you use an inhaler, bring it to the test.  Do not use any products that have nicotine or tobacco in them, such as cigarettes and e-cigarettes. Stop using them for 4 hours before the test. If you need help quitting, ask your doctor.  Wear comfortable shoes and clothing. What happens during the procedure?  You will be hooked up to a TV screen. Your doctor will watch the screen to see how fast your heart beats during the test.  Before you exercise, a computer will make a picture of your heart. To do this: ? A gel will be put on your  chest. ? A wand will be moved over the gel. ? Sound waves from the wand will go to the computer to make the picture.  Your will start walking on a treadmill. The treadmill will start at a slow speed. It will get faster a little bit at a time. When you walk faster, your heart will beat faster.  The treadmill will be stopped when your heart is working hard.  You will lie down right away so another picture of your heart can be taken.  The test will take 30-60 minutes. What happens after the procedure?  Your heart rate and blood pressure will be watched  after the test.  If your doctor says that you can, you may: ? Eat what you usually eat. ? Do your normal activities. ? Take medicines like normal. Summary  An exercise stress echocardiogram is a test that checks how well your heart is working.  Follow instructions about what you cannot eat or drink before the test. Ask your doctor if you should take your normal medicines before the test.  Stop having caffeine for 24 hours before the test. Do not use anything with nicotine or tobacco in it for 4 hours before the test.  A computer will take a picture of your heart before you walk on a treadmill. It will take another picture when you are done walking.  Your heart rate and blood pressure will be watched after the test. This information is not intended to replace advice given to you by your health care provider. Make sure you discuss any questions you have with your health care provider. Document Released: 01/13/2009 Document Revised: 12/10/2015 Document Reviewed: 12/10/2015 Elsevier Interactive Patient Education  2017 [employer].

## 2018-02-11 ENCOUNTER — Other Ambulatory Visit: Payer: Self-pay | Admitting: Cardiovascular Disease

## 2018-02-11 DIAGNOSIS — R079 Chest pain, unspecified: Secondary | ICD-10-CM

## 2018-02-11 NOTE — Assessment & Plan Note (Signed)
Eval with labs and refer to cardiology for further eval.

## 2018-02-17 ENCOUNTER — Ambulatory Visit: Payer: Federal, State, Local not specified - PPO | Admitting: Family Medicine

## 2018-02-18 ENCOUNTER — Other Ambulatory Visit: Payer: Federal, State, Local not specified - PPO

## 2018-02-24 ENCOUNTER — Ambulatory Visit (INDEPENDENT_AMBULATORY_CARE_PROVIDER_SITE_OTHER)
Admission: RE | Admit: 2018-02-24 | Discharge: 2018-02-24 | Disposition: A | Discharge status: Home / Self Care | Payer: Federal, State, Local not specified - PPO | Source: Ambulatory Visit | Attending: Cardiovascular Disease | Admitting: Cardiovascular Disease

## 2018-02-24 DIAGNOSIS — E7800 Pure hypercholesterolemia, unspecified: Secondary | ICD-10-CM

## 2018-02-24 DIAGNOSIS — R079 Chest pain, unspecified: Secondary | ICD-10-CM

## 2018-03-03 ENCOUNTER — Other Ambulatory Visit (HOSPITAL_COMMUNITY)
Admission: RE | Admit: 2018-03-03 | Discharge: 2018-03-03 | Disposition: A | Discharge status: Home / Self Care | Payer: Federal, State, Local not specified - PPO | Source: Ambulatory Visit | Attending: Family Medicine | Admitting: Family Medicine

## 2018-03-03 ENCOUNTER — Encounter: Payer: Self-pay | Admitting: Family Medicine

## 2018-03-03 ENCOUNTER — Ambulatory Visit (INDEPENDENT_AMBULATORY_CARE_PROVIDER_SITE_OTHER): Payer: Federal, State, Local not specified - PPO | Admitting: Family Medicine

## 2018-03-03 VITALS — BP 130/70 | HR 99 | Temp 97.8°F | Ht 62.25 in | Wt 160.5 lb

## 2018-03-03 DIAGNOSIS — R030 Elevated blood-pressure reading, without diagnosis of hypertension: Secondary | ICD-10-CM | POA: Diagnosis not present

## 2018-03-03 DIAGNOSIS — R05 Cough: Secondary | ICD-10-CM

## 2018-03-03 DIAGNOSIS — E119 Type 2 diabetes mellitus without complications: Secondary | ICD-10-CM | POA: Diagnosis not present

## 2018-03-03 DIAGNOSIS — R053 Chronic cough: Secondary | ICD-10-CM

## 2018-03-03 DIAGNOSIS — Z124 Encounter for screening for malignant neoplasm of cervix: Secondary | ICD-10-CM | POA: Insufficient documentation

## 2018-03-03 DIAGNOSIS — Z0000 Encounter for general adult medical examination without abnormal findings: Secondary | ICD-10-CM | POA: Diagnosis not present

## 2018-03-03 DIAGNOSIS — D509 Iron deficiency anemia, unspecified: Secondary | ICD-10-CM

## 2018-03-03 DIAGNOSIS — E7800 Pure hypercholesterolemia, unspecified: Secondary | ICD-10-CM

## 2018-03-03 DIAGNOSIS — N921 Excessive and frequent menstruation with irregular cycle: Secondary | ICD-10-CM

## 2018-03-03 NOTE — Assessment & Plan Note (Signed)
Trial of antihistamine.. SE to flonase.  if not better consider sinus eval or PPI.

## 2018-03-03 NOTE — Patient Instructions (Addendum)
Work on low cholesterol and low carbohydrate diet.  Get back to exercise 3-5 times a week. Please stop at the front desk to set up referral.  Can try trial of zyrtec at bedtime or Claritin in daytime for allergies and persistent cough.  Set up yearly eye exam.  Call to set up mammogram on your own.

## 2018-03-03 NOTE — Addendum Note (Signed)
Addended by: Damita LackLORING, Reinette Cuneo S on: 03/03/2018 11:01 AM   Modules accepted: Orders

## 2018-03-03 NOTE — Assessment & Plan Note (Signed)
HAs SE to iron... Hg nml .Marland Kitchen. Will eval menorrhagia first.

## 2018-03-03 NOTE — Progress Notes (Signed)
Subjective:    Patient ID: Judith Garcia, female    DOB: 04/01/1966, 52 y.o.   MRN: [id number]  HPI The patient is here for annual wellness exam and preventative care.    01/2018 Saw Dr. Mariah Milling for chest pain.  Ordered stress ECHO and cardiac scoring: pending  Elevated BP without Dx of HTN: Now in nml range. BP Readings from Last 3 Encounters:  03/03/18 130/70  02/02/18 122/88  01/15/18 (!) 144/90    Elevated Cholesterol:  LDL very high.. Statin recommended.. She is not interested in  Statin despite increased risk. Lab Results  Component Value Date   CHOL 298 (H) 01/15/2018   HDL 53.80 01/15/2018   LDLCALC 208 (H) 01/15/2018   TRIG 179.0 (H) 01/15/2018   CHOLHDL 6 01/15/2018  Using medications without problems: Muscle aches:  Diet compliance: She has been eating less carbs, less sweets, sodas Exercise: minimal in last month.. Usually 3 days a week. Other complaints:  Prediabetes ... New dx of diabetes.. Lab Results  Component Value Date   HGBA1C 7.1 (H) 01/15/2018   Persistent cough, 7-10 years:  Intolerant of flonase.  Allergy testing Nonsmoker.  CXR 2017  Anemia , iron deficiency.. Started on ferrous sulfate on 01/15/2018.Marland Kitchen Constipated her. Still having heavy menses.. 8 pads in one day, last 1-3 weeks.    ? Menopausal symptoms  Wt Readings from Last 3 Encounters:  03/03/18 160 lb 8 oz (72.8 kg)  02/02/18 158 lb 8 oz (71.9 kg)  01/15/18 161 lb 8 oz (73.3 kg)  Body mass index is 29.12 kg/m.   Social History /Family History/Past Medical History reviewed in detail and updated in EMR if needed. Blood pressure 130/70, pulse 99, temperature 97.8 F (36.6 C), temperature source Oral, height 5' 2.25" (1.581 m), weight 160 lb 8 oz (72.8 kg), last menstrual period 06/14/2017.  Review of Systems  Constitutional: Negative for fatigue and fever.  HENT: Negative for congestion.   Eyes: Negative for pain.  Respiratory: Negative for cough and shortness of breath.     Cardiovascular: Negative for chest pain, palpitations and leg swelling.  Gastrointestinal: Negative for abdominal pain.  Genitourinary: Negative for dysuria and vaginal bleeding.  Musculoskeletal: Negative for back pain.  Neurological: Negative for syncope, light-headedness and headaches.  Psychiatric/Behavioral: Negative for dysphoric mood.       Objective:   Physical Exam  Constitutional: Vital signs are normal. She appears well-developed and well-nourished. She is cooperative.  Non-toxic appearance. She does not appear ill. No distress.  HENT:  Head: Normocephalic.  Right Ear: Hearing, tympanic membrane, external ear and ear canal normal.  Left Ear: Hearing, tympanic membrane, external ear and ear canal normal.  Nose: Nose normal.  Eyes: Pupils are equal, round, and reactive to light. Conjunctivae, EOM and lids are normal. Lids are everted and swept, no foreign bodies found.  Neck: Trachea normal and normal range of motion. Neck supple. Carotid bruit is not present. No thyroid mass and no thyromegaly present.  Cardiovascular: Normal rate, regular rhythm, S1 normal, S2 normal, normal heart sounds and intact distal pulses. Exam reveals no gallop.  No murmur heard. Pulmonary/Chest: Effort normal and breath sounds normal. No respiratory distress. She has no wheezes. She has no rhonchi. She has no rales. No breast tenderness, discharge or bleeding.  Abdominal: Soft. Normal appearance and bowel sounds are normal. She exhibits no distension, no fluid wave, no abdominal bruit and no mass. There is no hepatosplenomegaly. There is no tenderness. There is no rebound, no guarding  and no CVA tenderness. No hernia.  Genitourinary: Vagina normal. No breast tenderness, discharge or bleeding. Pelvic exam was performed with patient supine. There is no rash, tenderness or lesion on the right labia. There is no rash, tenderness or lesion on the left labia. Uterus is enlarged and tender. Cervix exhibits no  motion tenderness, no discharge and no friability. Right adnexum displays no mass, no tenderness and no fullness. Left adnexum displays no mass, no tenderness and no fullness.  Lymphadenopathy:    She has no cervical adenopathy.    She has no axillary adenopathy.  Neurological: She is alert. She has normal strength. No cranial nerve deficit or sensory deficit.  Skin: Skin is warm, dry and intact. No rash noted.  Psychiatric: Her speech is normal and behavior is normal. Judgment normal. Her mood appears not anxious. Cognition and memory are normal. She does not exhibit a depressed mood.     Diabetic foot exam: Normal inspection No skin breakdown No calluses  Normal DP pulses Normal sensation to light touch and monofilament Nails normal      Assessment & Plan:  The patient's preventative maintenance and recommended screening tests for an annual wellness exam were reviewed in full today. Brought up to date unless services declined.  Counselled on the importance of diet, exercise, and its role in overall health and mortality. The patient's FH and SH was reviewed, including their home life, tobacco status, and drug and alcohol status.   Vaccines: refuses any vaccine Pap/DVE:  2016 pap neg   And no HPV.. Will repeat DVE given heavy bleeding Mammo:  04/2017.. Will schedule on own. Colon: 12/2016 repeat 10 years. Smoking Status: nonsmoker ETOH/ drug use: rarely  HIV screen:   declined

## 2018-03-03 NOTE — Assessment & Plan Note (Signed)
Resolved.. Now in nml range.

## 2018-03-03 NOTE — Assessment & Plan Note (Signed)
Refuses statin despite indication. Pt will discuss with Dr. Mariah MillingGollan.

## 2018-03-03 NOTE — Assessment & Plan Note (Signed)
Refer for US to eval.

## 2018-03-04 LAB — CYTOLOGY - PAP
ADEQUACY: ABSENT
DIAGNOSIS: NEGATIVE
HPV (WINDOPATH): NOT DETECTED

## 2018-03-09 ENCOUNTER — Other Ambulatory Visit: Payer: Federal, State, Local not specified - PPO

## 2018-03-10 ENCOUNTER — Telehealth: Payer: Self-pay | Admitting: *Deleted

## 2018-03-10 NOTE — Telephone Encounter (Signed)
-----   Message from Joline MaxcyJennifer B Harkins sent at 03/10/2018 11:49 AM EST ----- Regarding: Stress Echo  Scheduled 1/3 at 1130 . She wants to go over instructions/ restrictions.  ----- Message ----- From: Jefferey PicaMcGhee, Lorece Keach C, RN Sent: 03/10/2018  11:38 AM EST To: Jefferey PicaHeather C Cesareo Vickrey, RN, [facility] Scheduling  Patient had a stress echo scheduled for 11/20- cancelled due to change in provider schedule.  Please call to see if she is still interested in doing this.  Thank you!!

## 2018-03-11 NOTE — Telephone Encounter (Signed)
I attempted to call the patient.  I left a detailed message on her voice mail of the instructions below (ok per DPR) and advised I would also send a message through to her MyChart as well.  I asked that she call back with any further questions/ concerns.    - you may eat a light breakfast/ lunch prior to your procedure - no caffeine for 24 hours prior to your test (coffee, tea, soft drinks, or chocolate)  - no smoking/ vaping for 4 hours prior to your test - you may take your regular medications the day of your test  - wear comfortable clothing & tennis/ non-skid shoes to walk on the treadmill

## 2018-03-12 ENCOUNTER — Other Ambulatory Visit: Payer: Federal, State, Local not specified - PPO

## 2018-03-19 ENCOUNTER — Ambulatory Visit
Admission: RE | Admit: 2018-03-19 | Discharge: 2018-03-19 | Disposition: A | Discharge status: Home / Self Care | Payer: Federal, State, Local not specified - PPO | Source: Ambulatory Visit | Attending: Family Medicine | Admitting: Family Medicine

## 2018-03-19 DIAGNOSIS — N921 Excessive and frequent menstruation with irregular cycle: Secondary | ICD-10-CM

## 2018-03-19 DIAGNOSIS — D250 Submucous leiomyoma of uterus: Secondary | ICD-10-CM | POA: Diagnosis not present

## 2018-03-31 ENCOUNTER — Other Ambulatory Visit: Payer: Self-pay | Admitting: Family Medicine

## 2018-03-31 DIAGNOSIS — Z1231 Encounter for screening mammogram for malignant neoplasm of breast: Secondary | ICD-10-CM

## 2018-04-03 ENCOUNTER — Ambulatory Visit (INDEPENDENT_AMBULATORY_CARE_PROVIDER_SITE_OTHER): Payer: Federal, State, Local not specified - PPO

## 2018-04-03 ENCOUNTER — Other Ambulatory Visit: Payer: Self-pay

## 2018-04-03 DIAGNOSIS — R079 Chest pain, unspecified: Secondary | ICD-10-CM | POA: Diagnosis not present

## 2018-04-03 LAB — ECHOCARDIOGRAM STRESS TEST
CHL CUP MPHR: 168 {beats}/min
CHL CUP RESTING HR STRESS: 98 {beats}/min
CSEPEDS: 49 s
Estimated workload: 10.4 METS
Exercise duration (min): 8 min
Peak HR: 166 {beats}/min
Percent HR: 98 %

## 2018-04-06 ENCOUNTER — Telehealth: Payer: Self-pay

## 2018-04-06 NOTE — Telephone Encounter (Signed)
-----   Message from Antonieta Ibaimothy J Gollan, MD sent at 04/05/2018 10:59 AM EST ----- Stress test No significant abnormal findings, Would continue exercise program for conditioning

## 2018-04-06 NOTE — Telephone Encounter (Signed)
LMTCB

## 2018-04-08 NOTE — Telephone Encounter (Signed)
Patient calling to make sure there are no concerns she should have  after reading on mychart

## 2018-04-08 NOTE — Telephone Encounter (Signed)
Patient made aware of results and verbalized understanding.  

## 2018-04-08 NOTE — Telephone Encounter (Signed)
Patient made aware of results via MyChart on 04/06/2018. She has been advised to call back if she has any questions.

## 2018-04-13 ENCOUNTER — Ambulatory Visit
Admission: RE | Admit: 2018-04-13 | Discharge: 2018-04-13 | Disposition: A | Discharge status: Home / Self Care | Payer: Federal, State, Local not specified - PPO | Source: Ambulatory Visit | Attending: Family Medicine | Admitting: Family Medicine

## 2018-04-13 DIAGNOSIS — Z1231 Encounter for screening mammogram for malignant neoplasm of breast: Secondary | ICD-10-CM

## 2018-05-25 ENCOUNTER — Telehealth: Payer: Self-pay | Admitting: Family Medicine

## 2018-05-25 NOTE — Telephone Encounter (Signed)
-----   Message from Terri J Walsh sent at 05/19/2018  3:02 PM EST ----- Regarding: Lab orders for Tuesday,2.25.20 Lab orders for a 3 month follow up appt.  

## 2018-05-26 ENCOUNTER — Telehealth: Payer: Self-pay | Admitting: Family Medicine

## 2018-05-26 ENCOUNTER — Other Ambulatory Visit: Payer: Federal, State, Local not specified - PPO

## 2018-05-26 ENCOUNTER — Other Ambulatory Visit (INDEPENDENT_AMBULATORY_CARE_PROVIDER_SITE_OTHER): Payer: Federal, State, Local not specified - PPO

## 2018-05-26 DIAGNOSIS — Z8349 Family history of other endocrine, nutritional and metabolic diseases: Secondary | ICD-10-CM

## 2018-05-26 DIAGNOSIS — E119 Type 2 diabetes mellitus without complications: Secondary | ICD-10-CM

## 2018-05-26 DIAGNOSIS — D509 Iron deficiency anemia, unspecified: Secondary | ICD-10-CM

## 2018-05-26 LAB — CBC WITH DIFFERENTIAL/PLATELET
BASOS PCT: 0.6 % (ref 0.0–3.0)
Basophils Absolute: 0 10*3/uL (ref 0.0–0.1)
EOS PCT: 1.4 % (ref 0.0–5.0)
Eosinophils Absolute: 0.1 10*3/uL (ref 0.0–0.7)
HCT: 38 % (ref 36.0–46.0)
Hemoglobin: 11.9 g/dL — ABNORMAL LOW (ref 12.0–15.0)
Lymphocytes Relative: 47.2 % — ABNORMAL HIGH (ref 12.0–46.0)
Lymphs Abs: 3.2 10*3/uL (ref 0.7–4.0)
MCHC: 31.3 g/dL (ref 30.0–36.0)
MCV: 70.2 fl — ABNORMAL LOW (ref 78.0–100.0)
Monocytes Absolute: 0.4 10*3/uL (ref 0.1–1.0)
Monocytes Relative: 6.3 % (ref 3.0–12.0)
NEUTROS ABS: 3 10*3/uL (ref 1.4–7.7)
Neutrophils Relative %: 44.5 % (ref 43.0–77.0)
PLATELETS: 377 10*3/uL (ref 150.0–400.0)
RBC: 5.42 Mil/uL — ABNORMAL HIGH (ref 3.87–5.11)
RDW: 19 % — AB (ref 11.5–15.5)
WBC: 6.8 10*3/uL (ref 4.0–10.5)

## 2018-05-26 LAB — COMPREHENSIVE METABOLIC PANEL
ALBUMIN: 4.4 g/dL (ref 3.5–5.2)
ALT: 12 U/L (ref 0–35)
AST: 12 U/L (ref 0–37)
Alkaline Phosphatase: 94 U/L (ref 39–117)
BUN: 14 mg/dL (ref 6–23)
CHLORIDE: 101 meq/L (ref 96–112)
CO2: 24 meq/L (ref 19–32)
CREATININE: 0.77 mg/dL (ref 0.40–1.20)
Calcium: 9.4 mg/dL (ref 8.4–10.5)
GFR: 94.96 mL/min (ref >60.00)
GLUCOSE: 181 mg/dL — AB (ref 70–99)
Potassium: 3.8 mEq/L (ref 3.5–5.1)
SODIUM: 136 meq/L (ref 135–145)
Total Bilirubin: 0.4 mg/dL (ref 0.2–1.2)
Total Protein: 7.6 g/dL (ref 6.0–8.3)

## 2018-05-26 LAB — IBC + FERRITIN
Ferritin: 7.5 ng/mL — ABNORMAL LOW (ref 10.0–291.0)
IRON: 49 ug/dL (ref 42–145)
Saturation Ratios: 9.8 % — ABNORMAL LOW (ref 20.0–50.0)
Transferrin: 358 mg/dL (ref 212.0–360.0)

## 2018-05-26 LAB — LIPID PANEL
Cholesterol: 268 mg/dL — ABNORMAL HIGH (ref 0–200)
HDL: 54.2 mg/dL (ref >39.00)
LDL CALC: 177 mg/dL — AB (ref 0–99)
NONHDL: 214.26
Total CHOL/HDL Ratio: 5
Triglycerides: 184 mg/dL — ABNORMAL HIGH (ref 0.0–149.0)
VLDL: 36.8 mg/dL (ref 0.0–40.0)

## 2018-05-26 NOTE — Telephone Encounter (Signed)
-----   Message from Alvina Chou sent at 05/19/2018  3:02 PM EST ----- Regarding: Lab orders for Tuesday,2.25.20 Lab orders for a 3 month follow up appt.

## 2018-05-27 LAB — HEMOGLOBIN A1C
HEMOGLOBIN A1C: 6.4 %{Hb} — AB (ref <5.7)
Mean Plasma Glucose: 137 (calc)
eAG (mmol/L): 7.6 (calc)

## 2018-06-02 ENCOUNTER — Encounter: Payer: Self-pay | Admitting: Family Medicine

## 2018-06-02 ENCOUNTER — Ambulatory Visit: Payer: Federal, State, Local not specified - PPO | Admitting: Family Medicine

## 2018-06-02 VITALS — BP 122/76 | HR 88 | Temp 98.6°F | Ht 62.25 in | Wt 156.8 lb

## 2018-06-02 DIAGNOSIS — E7800 Pure hypercholesterolemia, unspecified: Secondary | ICD-10-CM | POA: Diagnosis not present

## 2018-06-02 DIAGNOSIS — Z6828 Body mass index (BMI) 28.0-28.9, adult: Secondary | ICD-10-CM | POA: Diagnosis not present

## 2018-06-02 DIAGNOSIS — D509 Iron deficiency anemia, unspecified: Secondary | ICD-10-CM

## 2018-06-02 DIAGNOSIS — R030 Elevated blood-pressure reading, without diagnosis of hypertension: Secondary | ICD-10-CM

## 2018-06-02 DIAGNOSIS — E119 Type 2 diabetes mellitus without complications: Secondary | ICD-10-CM | POA: Diagnosis not present

## 2018-06-02 DIAGNOSIS — N921 Excessive and frequent menstruation with irregular cycle: Secondary | ICD-10-CM

## 2018-06-02 LAB — MICROALBUMIN / CREATININE URINE RATIO
Creatinine,U: 151.7 mg/dL
Microalb Creat Ratio: 1.2 mg/g (ref 0.0–30.0)
Microalb, Ur: 1.8 mg/dL (ref 0.0–1.9)

## 2018-06-02 LAB — HM DIABETES FOOT EXAM

## 2018-06-02 NOTE — Assessment & Plan Note (Signed)
Good control in office Encouraged exercise, weight loss, healthy eating habits.

## 2018-06-02 NOTE — Patient Instructions (Addendum)
Set up yearly eye exam.  Call if need allergist referral.  Keep working on low fat diet, low sugar diet and regular exercise.   Stop at lab for urine test.

## 2018-06-02 NOTE — Addendum Note (Signed)
Addended by: Alvina Chou on: 06/02/2018 11:31 AM   Modules accepted: Orders

## 2018-06-02 NOTE — Assessment & Plan Note (Signed)
Likely perimenopausal. No menses in last 3 months, last one lasted 1 month steady but not heavy.

## 2018-06-02 NOTE — Assessment & Plan Note (Signed)
Stable

## 2018-06-02 NOTE — Progress Notes (Signed)
Subjective:    Patient ID: Judith Garcia, female    DOB: May 04, 1965, 53 y.o.   MRN: [id number]  HPI 53 year old female presents for follow up  DM, Chol   Itchy ears, post nasal drip and nasal passage red.  Claritin cause SE of sedation.  Flonase caused.. rash.   Diabetes:   Much improved with diet Lab Results  Component Value Date   HGBA1C 6.4 (H) 05/26/2018  Using medications without difficulties: Hypoglycemic episodes: Hyperglycemic episodes: Feet problems: no uclers Blood Sugars averaging: not checking eye exam within last year: due  Elevated Cholesterol:  LDL far from goal, high risk CAD Refused statin in past. She refuses mediation again today. Plans repeat stress test in 3 years.. Lab Results  Component Value Date   CHOL 268 (H) 05/26/2018   HDL 54.20 05/26/2018   LDLCALC 177 (H) 05/26/2018   TRIG 184.0 (H) 05/26/2018   CHOLHDL 5 05/26/2018  Using medications without problems: Muscle aches:  Diet compliance: low chol diet Exercise:  Off and on. Other complaints:    Wt Readings from Last 3 Encounters:  06/02/18 156 lb 12.8 oz (71.1 kg)  03/03/18 160 lb 8 oz (72.8 kg)  02/02/18 158 lb 8 oz (71.9 kg)     Wt Readings from Last 3 Encounters:  06/02/18 156 lb 12.8 oz (71.1 kg)  03/03/18 160 lb 8 oz (72.8 kg)  02/02/18 158 lb 8 oz (71.9 kg)       Social History /Family History/Past Medical History reviewed in detail and updated in EMR if needed. Blood pressure 122/76, pulse 88, temperature 98.6 F (37 C), height 5' 2.25" (1.581 m), weight 156 lb 12.8 oz (71.1 kg), last menstrual period 06/14/2017, SpO2 99 %.   Review of Systems  Constitutional: Negative for fatigue and fever.  HENT: Negative for congestion.   Eyes: Negative for pain.  Respiratory: Negative for cough and shortness of breath.   Cardiovascular: Negative for chest pain, palpitations and leg swelling.  Gastrointestinal: Negative for abdominal pain.  Genitourinary: Negative for dysuria  and vaginal bleeding.  Musculoskeletal: Negative for back pain.  Neurological: Negative for syncope, light-headedness and headaches.  Psychiatric/Behavioral: Negative for dysphoric mood.       Objective:   Physical Exam Constitutional:      General: She is not in acute distress.    Appearance: Normal appearance. She is well-developed. She is not ill-appearing or toxic-appearing.  HENT:     Head: Normocephalic.     Right Ear: Hearing, tympanic membrane, ear canal and external ear normal. Tympanic membrane is not erythematous, retracted or bulging.     Left Ear: Hearing, tympanic membrane, ear canal and external ear normal. Tympanic membrane is not erythematous, retracted or bulging.     Nose: No mucosal edema or rhinorrhea.     Right Sinus: No maxillary sinus tenderness or frontal sinus tenderness.     Left Sinus: No maxillary sinus tenderness or frontal sinus tenderness.     Mouth/Throat:     Pharynx: Uvula midline.  Eyes:     General: Lids are normal. Lids are everted, no foreign bodies appreciated.     Conjunctiva/sclera: Conjunctivae normal.     Pupils: Pupils are equal, round, and reactive to light.  Neck:     Musculoskeletal: Normal range of motion and neck supple.     Thyroid: No thyroid mass or thyromegaly.     Vascular: No carotid bruit.     Trachea: Trachea normal.  Cardiovascular:  Rate and Rhythm: Normal rate and regular rhythm.     Pulses: Normal pulses.     Heart sounds: Normal heart sounds, S1 normal and S2 normal. No murmur. No friction rub. No gallop.   Pulmonary:     Effort: Pulmonary effort is normal. No tachypnea or respiratory distress.     Breath sounds: Normal breath sounds. No decreased breath sounds, wheezing, rhonchi or rales.  Abdominal:     General: Bowel sounds are normal.     Palpations: Abdomen is soft.     Tenderness: There is no abdominal tenderness.  Skin:    General: Skin is warm and dry.     Findings: No rash.  Neurological:      Mental Status: She is alert.  Psychiatric:        Mood and Affect: Mood is not anxious or depressed.        Speech: Speech normal.        Behavior: Behavior normal. Behavior is cooperative.        Thought Content: Thought content normal.        Judgment: Judgment normal.       Diabetic foot exam: Normal inspection No skin breakdown No calluses  Normal DP pulses Normal sensation to light touch and monofilament Nails normal     Assessment & Plan:

## 2018-06-02 NOTE — Assessment & Plan Note (Addendum)
Diet controlled.  

## 2018-06-02 NOTE — Assessment & Plan Note (Addendum)
High risk for CVD Discussed in detail... she is stating that she does not think Dr. Mariah Milling recommended cholesterol med... she does not want to start it because she is convinced it will make her sugar worse.. She has requested I contact Dr. Mariah Milling to get his recommendations on cholesterol med.  Given hereditary high chol... will consider q3 year cardiac eval.

## 2018-06-06 ENCOUNTER — Telehealth: Payer: Self-pay | Admitting: Family Medicine

## 2018-06-06 NOTE — Telephone Encounter (Signed)
-----   Message from Antonieta Iba, MD sent at 06/05/2018  6:31 PM EST ----- It is encouraging that recent CT coronary calcium score was 0 There is certainly the possibility that she could have noncalcified plaque that does not show up on the CT her numbers are markedly elevated It is certainly her choice but given diabetes, prudent option would be to treat If she declines, could periodically screen with calcium scoring but again not on a percent perfect at picking up disease Thx TG  ----- Message ----- From: Excell Seltzer, MD Sent: 06/02/2018   5:48 PM EST To: Antonieta Iba, MD  This is a new patient of mine you saw for chest pain in 01/2018, negative work up. CAD in first degree relatives.  She now has diabetes ( when you saw her dx was prediabetes) but is much better controlled in last 3 months due to her hard work.  She continues to have poorly controlled cholesterol LDL 170s. I think a statin is clearly indicated in this patient  to at least get chol < 100. She has asked me to contact you to get your input on whether a statin medication is needed (she is fairly adamant that you told her she does not have to treat her cholesterol given it is hereditarily high) Please let me know what you think so we can all be on the same page.  Thanks, Amy

## 2018-06-06 NOTE — Telephone Encounter (Signed)
Please send pt letter: . After Dr. Windell Hummingbird review of you cholesterol levels, depsite the encouraging result of your recent CT coronary calcium score at 0, he recommends (especially with new diagosis of diabetes) initiation of a statin medication for cholesterol reduction. I know that you are not currently interested in a medication to treat cholesterol but I wanted to let you know the cardiologist's recommendations. Call if you have changed you mind about treatment, but otherwise keep working on low cholesterol diet, exercise and weight loss. We will follow the cholesterol as planned.  Thanks!

## 2018-06-08 ENCOUNTER — Encounter: Payer: Self-pay | Admitting: *Deleted

## 2018-06-08 NOTE — Telephone Encounter (Signed)
Letter written as instructed by Dr. Ermalene Searing.  Sent via [employer] and mail.

## 2018-09-03 ENCOUNTER — Other Ambulatory Visit: Payer: Federal, State, Local not specified - PPO

## 2018-09-08 ENCOUNTER — Ambulatory Visit: Payer: Federal, State, Local not specified - PPO | Admitting: Family Medicine

## 2019-10-29 ENCOUNTER — Ambulatory Visit: Payer: Federal, State, Local not specified - PPO | Attending: Internal Medicine

## 2019-10-29 DIAGNOSIS — Z20822 Contact with and (suspected) exposure to covid-19: Secondary | ICD-10-CM | POA: Diagnosis not present

## 2019-10-30 LAB — NOVEL CORONAVIRUS, NAA: SARS-CoV-2, NAA: NOT DETECTED

## 2019-10-30 LAB — SARS-COV-2, NAA 2 DAY TAT

## 2019-11-25 DIAGNOSIS — Z20822 Contact with and (suspected) exposure to covid-19: Secondary | ICD-10-CM | POA: Diagnosis not present

## 2019-11-30 ENCOUNTER — Telehealth: Payer: Self-pay | Admitting: *Deleted

## 2019-11-30 NOTE — Telephone Encounter (Signed)
She can take Mucinex DM twice daily She is higher risk for  complications from COVID.Marland Kitchen so she is a candidate for monoclonal antibody infusion..this decreases risk of complications in a patient with mild symptoms.  If she is interested let me know.. or if she has questions.. make a virtual.

## 2019-11-30 NOTE — Telephone Encounter (Signed)
Patient's husband called stating that his wife tested positive for covid on 11/26/19 at [facility]..  Patient's husband stated that his wife has a cough and some tiredness. Patient's husband denies that she has a fever, SOB or difficulty breathing. Patient's husband stated that she needs to get some OTC cough medication and wants to know what her doctor would recommend. Mr. Outten stated that he does not fell that she needs to be seen. Patient's husband was given ER precautions and he verbalized understanding.

## 2019-11-30 NOTE — Telephone Encounter (Signed)
[name] notified as instructed by telephone.  He wants to hold off on the infusion at this time.

## 2019-12-01 ENCOUNTER — Encounter: Payer: Self-pay | Admitting: Family Medicine

## 2019-12-01 ENCOUNTER — Telehealth (INDEPENDENT_AMBULATORY_CARE_PROVIDER_SITE_OTHER): Payer: Federal, State, Local not specified - PPO | Admitting: Family Medicine

## 2019-12-01 ENCOUNTER — Other Ambulatory Visit: Payer: Self-pay

## 2019-12-01 ENCOUNTER — Telehealth: Payer: Self-pay | Admitting: Family Medicine

## 2019-12-01 ENCOUNTER — Telehealth: Payer: Self-pay | Admitting: Physician Assistant

## 2019-12-01 DIAGNOSIS — U071 COVID-19: Secondary | ICD-10-CM

## 2019-12-01 MED ORDER — CHERATUSSIN AC 100-10 MG/5ML PO SOLN: 5.0000 mL | Freq: Two times a day (BID) | ORAL | 0 refills | Status: DC | PRN

## 2019-12-01 NOTE — Assessment & Plan Note (Addendum)
Today is day 7 of symptoms.  Advised self quarantine for 10 days.  Will notify monoclonal antibody infusion treatment team about patient to call and see if she is eligible for treatment.  Reviewed supportive care. rec cheratussin codeine cough syrup for night time to suppress cough and facilitate rest. rec start vit D, vit C and zinc immune supplements.  Reviewed anticipated course of recovery as well as indications to seek in person care including worsening cough with dyspnea, O2 sats dropping and staying below 90%, cyanosis.  Will place on COVID call list and check on patient each day for update on symptoms.

## 2019-12-01 NOTE — Telephone Encounter (Signed)
Called to discuss with Judith Garcia about Covid symptoms and the use of casirivimab/imdevimab, a monoclonal antibody infusion for those with mild to moderate Covid symptoms and at a high risk of hospitalization.     Pt is qualified for this infusion at the monoclonal antibody infusion center due to co-morbid conditions and/or a member of an at-risk group, however declines infusion at this time. Symptoms tier reviewed as well as criteria for ending isolation.  Symptoms reviewed that would warrant ED/Hospital evaluation. Preventative practices reviewed. Patient verbalized understanding. Patient advised to call back if he decides that he does want to get infusion. Callback number to the infusion center given. Patient advised to go to Urgent care or ED with severe symptoms. Last date pt would be eligible for infusion is 12/02/19.   Pt does not want to leave her house. We had a long conversation about vaccination and she thinks she may now opt to get the vaccine after her acute illness and quarantine period are over.    Patient Active Problem List   Diagnosis Date Noted  . COVID-19 virus infection 12/01/2019  . Chest pain with moderate risk for cardiac etiology 02/01/2018  . High cholesterol 01/15/2018  . Iron deficiency anemia 01/15/2018  . Menorrhagia with irregular cycle 01/15/2018  . Elevated blood pressure reading in office without diagnosis of hypertension 01/15/2018  . BMI 28.0-28.9,adult 01/15/2018  . Cough, persistent 01/15/2018  . Diabetes mellitus type 2, uncomplicated (HCC) 01/15/2018  . Family history of thyroid disease in mother 01/15/2018    Cline Crock PA-C

## 2019-12-01 NOTE — Telephone Encounter (Signed)
Patient's husband called wanting to know if his wife could be given a prescriptions that is stronger than Mucinex DM. Patient's husband stated that when she takes a deep breath she coughs. Patient's husband denies that his wife has had a fever, SOB or difficulty breathing. Read patient's husband Dr. Daphine Deutscher notes from yesterday. Dr. Sharen Hones had a cancellation today so patient was added for a virtual visit today at 11:30.

## 2019-12-01 NOTE — Progress Notes (Signed)
Virtual visit completed through MyChart, a video enabled telemedicine application. Due to national recommendations of social distancing due to COVID-19, a virtual visit is felt to be most appropriate for this patient at this time. Reviewed limitations, risks, security and privacy concerns of performing a virtual visit and the availability of in person appointments. I also reviewed that there may be a patient responsible charge related to this service. The patient agreed to proceed.   Patient location: home Provider location: [place] at [facility], office Persons participating in this virtual visit: patient, provider, husband also present  If any vitals were documented, they were collected by patient at home unless specified below.    BP 132/85   Pulse (!) 118   Temp 98.1 F (36.7 C)   Ht 5' 2.25" (1.581 m)   Wt 153 lb (69.4 kg)   LMP 06/14/2017   SpO2 97%   BMI 27.76 kg/m    CC: COVID +  Subjective:    Patient ID: Judith Garcia, female    DOB: 28-Jan-1966, 54 y.o.   MRN: [id number]  HPI: Judith Garcia is a 54 y.o. female presenting on 12/01/2019 for Cough (C/o cough, feeling tired, loss of taste/smell, nausea diarrhea, HA and feeling lightheaded.  Tested positive for COVID 11/26/19.  Sxs started 11/23/19.  )   Initial day of symptoms 11/23/2019 Tested positive on 11/26/2019 [facility] [place].  Initially cough, nausea, diarrhea, loss of taste and smell, body aches, PNdrainage and HA. Post tussive emesis. Ongoing fatigue.  Last night may have had episode of syncope.   No fevers/chills, significant dyspnea, ST.  Treating with theraflu, warm chi tea, elderberry, delsym for cough.   Has not had COVID vaccine.  Husband previously had COVID, largely asymptomatic.  Daughter also currently with symptoms.   No h/o asthma or smoking.      Relevant past medical, surgical, family and social history reviewed and updated as indicated. Interim medical history since our last visit  reviewed. Allergies and medications reviewed and updated. Outpatient Medications Prior to Visit  Medication Sig Dispense Refill  . ELDERBERRY PO Take by mouth as needed.     . Flaxseed, Linseed, (FLAX SEEDS PO) Take 1 tablet by mouth daily as needed.    Marland Kitchen GARLIC PO Take 1 tablet by mouth daily as needed.    . Multiple Vitamin (MULTIVITAMIN WITH MINERALS) TABS tablet Take 1 tablet by mouth daily.    . Probiotic Product (PROBIOTIC PO) Take by mouth daily.     No facility-administered medications prior to visit.     Per HPI unless specifically indicated in ROS section below Review of Systems Objective:  BP 132/85   Pulse (!) 118   Temp 98.1 F (36.7 C)   Ht 5' 2.25" (1.581 m)   Wt 153 lb (69.4 kg)   LMP 06/14/2017   SpO2 97%   BMI 27.76 kg/m   Wt Readings from Last 3 Encounters:  12/01/19 153 lb (69.4 kg)  06/02/18 156 lb 12.8 oz (71.1 kg)  03/03/18 160 lb 8 oz (72.8 kg)       Physical exam: Gen: alert, NAD, not ill appearing Pulm: speaks in complete sentences without increased work of breathing, intermittent cough present Psych: normal mood, normal thought content      Results for orders placed or performed in visit on 10/29/19  Novel Coronavirus, NAA ([facility])   Specimen: Oropharyngeal(OP) collection in vial transport medium   Oropharyngea  Testing  Result Value Ref Range   SARS-CoV-2,  NAA Not Detected Not Detected  SARS-COV-2, NAA 2 DAY TAT   Oropharyngea  Testing  Result Value Ref Range   SARS-CoV-2, NAA 2 DAY TAT Performed    Assessment & Plan:   Problem List Items Addressed This Visit    COVID-19 virus infection    Today is day 7 of symptoms.  Advised self quarantine for 10 days.  Will notify monoclonal antibody infusion treatment team about patient to call and see if she is eligible for treatment.  Reviewed supportive care. rec cheratussin codeine cough syrup for night time to suppress cough and facilitate rest. rec start vit D, vit C and zinc immune  supplements.  Reviewed anticipated course of recovery as well as indications to seek in person care including worsening cough with dyspnea, O2 sats dropping and staying below 90%, cyanosis.  Will place on COVID call list and check on patient each day for update on symptoms.           Meds ordered this encounter  Medications  . guaiFENesin-codeine (CHERATUSSIN AC) 100-10 MG/5ML syrup    Sig: Take 5 mLs by mouth 2 (two) times daily as needed for cough (sedation precautions).    Dispense:  120 mL    Refill:  0   No orders of the defined types were placed in this encounter.   I discussed the assessment and treatment plan with the patient. The patient was provided an opportunity to ask questions and all were answered. The patient agreed with the plan and demonstrated an understanding of the instructions. The patient was advised to call back or seek an in-person evaluation if the symptoms worsen or if the condition fails to improve as anticipated.  Follow up plan: No follow-ups on file.  Eustaquio Boyden, MD

## 2019-12-01 NOTE — Telephone Encounter (Signed)
Positive COVID pt seen today virtually.  I reported demographics to mAb infusion team. Please place on covid call list, call tomorrow and again Friday for update on symptoms.

## 2019-12-01 NOTE — Telephone Encounter (Signed)
Noted.  Pt added to Call Log.  

## 2019-12-02 NOTE — Telephone Encounter (Signed)
Lvm asking pt to call back.  Need to get update on sxs.  

## 2019-12-02 NOTE — Telephone Encounter (Signed)
Noted! Thank you

## 2019-12-03 NOTE — Telephone Encounter (Signed)
Lvm asking pt to call back.  Need to get update on sxs.  

## 2019-12-07 ENCOUNTER — Inpatient Hospital Stay (HOSPITAL_COMMUNITY)
Admission: EM | Admit: 2019-12-07 | Discharge: 2019-12-09 | DRG: 177 | Disposition: A | Discharge status: Home / Self Care | Payer: Federal, State, Local not specified - PPO | Source: Ambulatory Visit | Attending: Internal Medicine | Admitting: Internal Medicine

## 2019-12-07 ENCOUNTER — Emergency Department (HOSPITAL_COMMUNITY): Payer: Federal, State, Local not specified - PPO

## 2019-12-07 ENCOUNTER — Other Ambulatory Visit: Payer: Self-pay

## 2019-12-07 ENCOUNTER — Telehealth: Payer: Self-pay | Admitting: *Deleted

## 2019-12-07 ENCOUNTER — Encounter: Payer: Self-pay | Admitting: Emergency Medicine

## 2019-12-07 ENCOUNTER — Ambulatory Visit
Admission: EM | Admit: 2019-12-07 | Discharge: 2019-12-07 | Disposition: A | Discharge status: Other Acute Inpatient Hospital | Payer: Federal, State, Local not specified - PPO

## 2019-12-07 ENCOUNTER — Encounter (HOSPITAL_COMMUNITY): Payer: Self-pay | Admitting: Obstetrics and Gynecology

## 2019-12-07 DIAGNOSIS — R0602 Shortness of breath: Secondary | ICD-10-CM | POA: Diagnosis not present

## 2019-12-07 DIAGNOSIS — J189 Pneumonia, unspecified organism: Secondary | ICD-10-CM | POA: Diagnosis not present

## 2019-12-07 DIAGNOSIS — E785 Hyperlipidemia, unspecified: Secondary | ICD-10-CM | POA: Diagnosis not present

## 2019-12-07 DIAGNOSIS — E7800 Pure hypercholesterolemia, unspecified: Secondary | ICD-10-CM | POA: Diagnosis present

## 2019-12-07 DIAGNOSIS — Z8632 Personal history of gestational diabetes: Secondary | ICD-10-CM

## 2019-12-07 DIAGNOSIS — J9601 Acute respiratory failure with hypoxia: Secondary | ICD-10-CM | POA: Diagnosis present

## 2019-12-07 DIAGNOSIS — U071 COVID-19: Principal | ICD-10-CM | POA: Diagnosis present

## 2019-12-07 DIAGNOSIS — Z833 Family history of diabetes mellitus: Secondary | ICD-10-CM

## 2019-12-07 DIAGNOSIS — E1169 Type 2 diabetes mellitus with other specified complication: Secondary | ICD-10-CM | POA: Diagnosis present

## 2019-12-07 DIAGNOSIS — E876 Hypokalemia: Secondary | ICD-10-CM | POA: Diagnosis present

## 2019-12-07 DIAGNOSIS — Z811 Family history of alcohol abuse and dependence: Secondary | ICD-10-CM | POA: Diagnosis not present

## 2019-12-07 DIAGNOSIS — T380X5A Adverse effect of glucocorticoids and synthetic analogues, initial encounter: Secondary | ICD-10-CM | POA: Diagnosis not present

## 2019-12-07 DIAGNOSIS — Z8249 Family history of ischemic heart disease and other diseases of the circulatory system: Secondary | ICD-10-CM

## 2019-12-07 DIAGNOSIS — R000 Tachycardia, unspecified: Secondary | ICD-10-CM

## 2019-12-07 DIAGNOSIS — E1165 Type 2 diabetes mellitus with hyperglycemia: Secondary | ICD-10-CM | POA: Diagnosis not present

## 2019-12-07 DIAGNOSIS — R0682 Tachypnea, not elsewhere classified: Secondary | ICD-10-CM

## 2019-12-07 DIAGNOSIS — A419 Sepsis, unspecified organism: Secondary | ICD-10-CM

## 2019-12-07 DIAGNOSIS — R0902 Hypoxemia: Secondary | ICD-10-CM

## 2019-12-07 DIAGNOSIS — J1282 Pneumonia due to coronavirus disease 2019: Secondary | ICD-10-CM | POA: Diagnosis present

## 2019-12-07 LAB — URINALYSIS, ROUTINE W REFLEX MICROSCOPIC
Bilirubin Urine: NEGATIVE
Glucose, UA: >=500 mg/dL — AB
Ketones, ur: 80 mg/dL — AB
Leukocytes,Ua: NEGATIVE
Nitrite: NEGATIVE
Protein, ur: 30 mg/dL — AB
Specific Gravity, Urine: 1.014 (ref 1.005–1.030)
pH: 5 (ref 5.0–8.0)

## 2019-12-07 LAB — COMPREHENSIVE METABOLIC PANEL
ALT: 18 U/L (ref 0–44)
AST: 19 U/L (ref 15–41)
Albumin: 3.5 g/dL (ref 3.5–5.0)
Alkaline Phosphatase: 81 U/L (ref 38–126)
Anion gap: 18 — ABNORMAL HIGH (ref 5–15)
BUN: 9 mg/dL (ref 6–20)
CO2: 23 mmol/L (ref 22–32)
Calcium: 9.2 mg/dL (ref 8.9–10.3)
Chloride: 93 mmol/L — ABNORMAL LOW (ref 98–111)
Creatinine, Ser: 0.73 mg/dL (ref 0.44–1.00)
GFR calc Af Amer: >60 mL/min (ref >60)
GFR calc non Af Amer: >60 mL/min (ref >60)
Glucose, Bld: 261 mg/dL — ABNORMAL HIGH (ref 70–99)
Potassium: 2.7 mmol/L — CL (ref 3.5–5.1)
Sodium: 134 mmol/L — ABNORMAL LOW (ref 135–145)
Total Bilirubin: 1.1 mg/dL (ref 0.3–1.2)
Total Protein: 7.8 g/dL (ref 6.5–8.1)

## 2019-12-07 LAB — CBC WITH DIFFERENTIAL/PLATELET
Abs Immature Granulocytes: 0.08 10*3/uL — ABNORMAL HIGH (ref 0.00–0.07)
Basophils Absolute: 0 10*3/uL (ref 0.0–0.1)
Basophils Relative: 0 %
Eosinophils Absolute: 0 10*3/uL (ref 0.0–0.5)
Eosinophils Relative: 0 %
HCT: 41.3 % (ref 36.0–46.0)
Hemoglobin: 13.9 g/dL (ref 12.0–15.0)
Immature Granulocytes: 1 %
Lymphocytes Relative: 18 %
Lymphs Abs: 1.6 10*3/uL (ref 0.7–4.0)
MCH: 23.6 pg — ABNORMAL LOW (ref 26.0–34.0)
MCHC: 33.7 g/dL (ref 30.0–36.0)
MCV: 70.1 fL — ABNORMAL LOW (ref 80.0–100.0)
Monocytes Absolute: 0.8 10*3/uL (ref 0.1–1.0)
Monocytes Relative: 9 %
Neutro Abs: 6.2 10*3/uL (ref 1.7–7.7)
Neutrophils Relative %: 72 %
Platelets: 434 10*3/uL — ABNORMAL HIGH (ref 150–400)
RBC: 5.89 MIL/uL — ABNORMAL HIGH (ref 3.87–5.11)
RDW: 19.3 % — ABNORMAL HIGH (ref 11.5–15.5)
WBC: 8.7 10*3/uL (ref 4.0–10.5)
nRBC: 0 % (ref 0.0–0.2)

## 2019-12-07 LAB — LACTIC ACID, PLASMA
Lactic Acid, Venous: 2 mmol/L (ref 0.5–1.9)
Lactic Acid, Venous: 1.4 mmol/L (ref 0.5–1.9)

## 2019-12-07 LAB — LACTATE DEHYDROGENASE: LDH: 256 U/L — ABNORMAL HIGH (ref 98–192)

## 2019-12-07 LAB — SARS CORONAVIRUS 2 BY RT PCR (HOSPITAL ORDER, PERFORMED IN ~~LOC~~ HOSPITAL LAB): SARS Coronavirus 2: POSITIVE — AB

## 2019-12-07 LAB — MAGNESIUM: Magnesium: 2 mg/dL (ref 1.7–2.4)

## 2019-12-07 MED ORDER — ACETAMINOPHEN 325 MG PO TABS: 650.0000 mg | ORAL_TABLET | Freq: Four times a day (QID) | ORAL | Status: DC | PRN

## 2019-12-07 MED ORDER — SODIUM CHLORIDE 0.9 % IV BOLUS
1000.0000 mL | Freq: Once | INTRAVENOUS | Status: AC
  Administered 2019-12-07: 1000 mL via INTRAVENOUS

## 2019-12-07 MED ORDER — VITAMIN D 25 MCG (1000 UNIT) PO TABS
1000.0000 [IU] | ORAL_TABLET | Freq: Every day | ORAL | Status: DC
  Administered 2019-12-08 – 2019-12-09 (×2): 1000 [IU] via ORAL
  Filled 2019-12-07 (×2): qty 1

## 2019-12-07 MED ORDER — POTASSIUM CHLORIDE 10 MEQ/100ML IV SOLN
10.0000 meq | INTRAVENOUS | Status: AC
  Administered 2019-12-07 (×3): 10 meq via INTRAVENOUS
  Filled 2019-12-07 (×3): qty 100

## 2019-12-07 MED ORDER — METHYLPREDNISOLONE SODIUM SUCC 40 MG IJ SOLR
0.5000 mg/kg | Freq: Two times a day (BID) | INTRAMUSCULAR | Status: DC
  Administered 2019-12-08 (×2): 34.8 mg via INTRAVENOUS
  Filled 2019-12-07: qty 0.87
  Filled 2019-12-07 (×2): qty 1

## 2019-12-07 MED ORDER — ZINC SULFATE 220 (50 ZN) MG PO CAPS
220.0000 mg | ORAL_CAPSULE | Freq: Every day | ORAL | Status: DC
  Administered 2019-12-08 – 2019-12-09 (×2): 220 mg via ORAL
  Filled 2019-12-07 (×2): qty 1

## 2019-12-07 MED ORDER — INSULIN ASPART 100 UNIT/ML ~~LOC~~ SOLN
0.0000 [IU] | Freq: Three times a day (TID) | SUBCUTANEOUS | Status: DC
  Administered 2019-12-08: 9 [IU] via SUBCUTANEOUS
  Administered 2019-12-08: 5 [IU] via SUBCUTANEOUS
  Administered 2019-12-08 – 2019-12-09 (×3): 7 [IU] via SUBCUTANEOUS
  Filled 2019-12-07: qty 0.09

## 2019-12-07 MED ORDER — GUAIFENESIN-DM 100-10 MG/5ML PO SYRP: 10.0000 mL | ORAL_SOLUTION | ORAL | Status: DC | PRN

## 2019-12-07 MED ORDER — INSULIN ASPART 100 UNIT/ML ~~LOC~~ SOLN
0.0000 [IU] | Freq: Every day | SUBCUTANEOUS | Status: DC
  Administered 2019-12-08: 4 [IU] via SUBCUTANEOUS
  Administered 2019-12-08: 3 [IU] via SUBCUTANEOUS
  Filled 2019-12-07: qty 0.05

## 2019-12-07 MED ORDER — ASCORBIC ACID 500 MG PO TABS
500.0000 mg | ORAL_TABLET | Freq: Every day | ORAL | Status: DC
  Administered 2019-12-08 – 2019-12-09 (×2): 500 mg via ORAL
  Filled 2019-12-07 (×2): qty 1

## 2019-12-07 MED ORDER — ENOXAPARIN SODIUM 40 MG/0.4ML ~~LOC~~ SOLN
40.0000 mg | Freq: Every day | SUBCUTANEOUS | Status: DC
  Administered 2019-12-07 – 2019-12-08 (×2): 40 mg via SUBCUTANEOUS
  Filled 2019-12-07 (×3): qty 0.4

## 2019-12-07 MED ORDER — POTASSIUM CHLORIDE CRYS ER 20 MEQ PO TBCR
40.0000 meq | EXTENDED_RELEASE_TABLET | Freq: Once | ORAL | Status: AC
  Administered 2019-12-07: 40 meq via ORAL
  Filled 2019-12-07: qty 2

## 2019-12-07 MED ORDER — SODIUM CHLORIDE 0.9 % IV SOLN
200.0000 mg | Freq: Once | INTRAVENOUS | Status: AC
  Administered 2019-12-07: 200 mg via INTRAVENOUS
  Filled 2019-12-07: qty 200

## 2019-12-07 MED ORDER — HYDROCOD POLST-CPM POLST ER 10-8 MG/5ML PO SUER
5.0000 mL | Freq: Two times a day (BID) | ORAL | Status: DC | PRN
  Administered 2019-12-08: 5 mL via ORAL
  Filled 2019-12-07: qty 5

## 2019-12-07 MED ORDER — SODIUM CHLORIDE 0.9 % IV SOLN
100.0000 mg | Freq: Every day | INTRAVENOUS | Status: DC
  Administered 2019-12-08 – 2019-12-09 (×2): 100 mg via INTRAVENOUS
  Filled 2019-12-07 (×2): qty 20

## 2019-12-07 MED ORDER — DEXAMETHASONE SODIUM PHOSPHATE 10 MG/ML IJ SOLN
6.0000 mg | Freq: Once | INTRAMUSCULAR | Status: AC
  Administered 2019-12-07: 6 mg via INTRAVENOUS
  Filled 2019-12-07: qty 1

## 2019-12-07 MED ORDER — PREDNISONE 50 MG PO TABS: 50.0000 mg | ORAL_TABLET | Freq: Every day | ORAL | Status: DC

## 2019-12-07 NOTE — ED Notes (Signed)
Date and time results received: 12/07/19 1925 (use smartphrase ".now" to insert current time)  Test: Lactic Acid; Potassium Critical Value: 2.0; 2.7  Name of Provider Notified: Dr.J Lynelle Doctor  Orders Received? Or Actions Taken?:

## 2019-12-07 NOTE — ED Notes (Signed)
Pt placed on 4 L [place]  

## 2019-12-07 NOTE — Telephone Encounter (Signed)
Patient's husband called stating that his wife is coughing a lot when she takes a deep breath. Patient got on the phone and stated that she having some labored breathing. Patient stated that she is not having SOB. Patient stated that she does not want to go anywhere that she has to wait 3 hours to be seen. Patient stated that she feels that she may need some type of treatment. Patient was advised with her labored breathing she probably should go to the ER, but patient does not want to go to the ER. Patient was advised that she needs a face to face and someone needs to listen to her lungs. Patient and her husband agreed that she will go to the Urgent Care at [facility] to be evaluated. Patient was advised that message will go back to Dr. Ermalene Searing for her to review.Marland Kitchen

## 2019-12-07 NOTE — Telephone Encounter (Signed)
Patient is currently at Urgent Care on [facility].

## 2019-12-07 NOTE — ED Notes (Signed)
Called EMS to transport pt

## 2019-12-07 NOTE — H&P (Signed)
History and Physical    Indy Prestwood WRU:[id number] DOB: 09-10-1965 DOA: 12/07/2019  PCP: Excell Seltzer, MD Patient coming from: Urgent care  Chief Complaint: Shortness of breath, hypoxia, Covid positive  HPI: Judith Garcia is a 54 y.o. female with medical history significant of anxiety, hyperlipidemia presenting to the ED via EMS from urgent care with complaints of fatigue and shortness of breath.  Oxygen saturation 90% on room air at urgent care and she was placed on 3 L supplemental oxygen.  She recently tested positive for COVID on 8/27, [facility]  Patient states she has not been vaccinated against Covid.  Her symptoms started 8/24 and include generalized weakness, chills, cough, shortness of breath, and poor oral intake.  She was previously having diarrhea which has now resolved.  Denies chest pain, abdominal pain, nausea, or vomiting.  States she has type 2 diabetes but has never been on any medication.  States she has not seen her doctor in a while and her last A1c was 6.4 last year.  Also reports having high cholesterol for which she is not on any medication.  ED Course: Afebrile.  Tachycardic and tachypneic.  Requiring 2 L supplemental oxygen.  WBC 8.7, hemoglobin 13.9, hematocrit 41.3, platelet 434k.  Sodium 134, potassium 2.7, chloride 93, bicarb 23, BUN 9, creatinine 0.7, glucose 261.  LFTs normal.  Lactic acid 2.0 >1.4.  SARS-CoV-2 PCR test positive.  UA not suggestive of infection.  Chest x-ray showing multifocal peripheral and basilar predominant patchy opacities concerning for pneumonia.  Patient was given IV Decadron 6 mg, remdesivir, and potassium supplementation.  Review of Systems:  All systems reviewed and apart from history of presenting illness, are negative.  Past Medical History:  Diagnosis Date  . Anemia   . Anxiety   . Gestational diabetes   . Hyperlipidemia     Past Surgical History:  Procedure Laterality Date  . CESAREAN SECTION    . ceserean     . CHOLECYSTECTOMY       reports that she has never smoked. She has never used smokeless tobacco. She reports that she does not drink alcohol and does not use drugs.  No Known Allergies  Family History  Problem Relation Age of Onset  . Diabetes Mother   . Alcohol abuse Father   . Heart disease Sister   . Breast cancer Neg Hx     Prior to Admission medications   Medication Sig Start Date End Date Taking? Authorizing Provider  guaiFENesin-codeine (CHERATUSSIN AC) 100-10 MG/5ML syrup Take 5 mLs by mouth 2 (two) times daily as needed for cough (sedation precautions). 12/01/19  Yes Eustaquio Boyden, MD  Flaxseed, Linseed, (FLAX SEEDS PO) Take 1 tablet by mouth daily as needed. Patient not taking: Reported on 12/07/2019    [provider]    Physical Exam: Vitals:   12/07/19 1900 12/07/19 2134 12/07/19 2135 12/07/19 2300  BP: 133/87 (!) 157/95 (!) 157/95 135/82  Pulse: (!) 108  (!) 101 95  Resp: (!) 35 (!) 30 (!) 33 (!) 25  Temp:      TempSrc:      SpO2: 99%  96% 99%    Physical Exam Constitutional:      General: She is not in acute distress. HENT:     Head: Normocephalic and atraumatic.  Eyes:     Extraocular Movements: Extraocular movements intact.     Conjunctiva/sclera: Conjunctivae normal.  Cardiovascular:     Rate and Rhythm: Regular rhythm. Tachycardia present.  Pulses: Normal pulses.     Comments: Mildly tachycardic Pulmonary:     Breath sounds: Rales present. No wheezing.     Comments: Respiratory rate in the mid to high 20s Satting in the upper 90s on 2 L supplemental oxygen Abdominal:     General: Bowel sounds are normal. There is no distension.     Palpations: Abdomen is soft.     Tenderness: There is no abdominal tenderness. There is no guarding.  Musculoskeletal:        General: No swelling or tenderness.     Cervical back: Normal range of motion and neck supple.  Skin:    General: Skin is warm and dry.  Neurological:     Mental Status:  She is alert and oriented to person, place, and time.     Labs on Admission: I have personally reviewed following labs and imaging studies  CBC: Recent Labs  Lab 12/07/19 1819  WBC 8.7  NEUTROABS 6.2  HGB 13.9  HCT 41.3  MCV 70.1*  PLT 434*   Basic Metabolic Panel: Recent Labs  Lab 12/07/19 1819  NA 134*  K 2.7*  CL 93*  CO2 23  GLUCOSE 261*  BUN 9  CREATININE 0.73  CALCIUM 9.2   GFR: Estimated Creatinine Clearance: 73.9 mL/min (by C-G formula based on SCr of 0.73 mg/dL). Liver Function Tests: Recent Labs  Lab 12/07/19 1819  AST 19  ALT 18  ALKPHOS 81  BILITOT 1.1  PROT 7.8  ALBUMIN 3.5   No results for input(s): LIPASE, AMYLASE in the last 168 hours. No results for input(s): AMMONIA in the last 168 hours. Coagulation Profile: No results for input(s): INR, PROTIME in the last 168 hours. Cardiac Enzymes: No results for input(s): CKTOTAL, CKMB, CKMBINDEX, TROPONINI in the last 168 hours. BNP (last 3 results) No results for input(s): PROBNP in the last 8760 hours. HbA1C: No results for input(s): HGBA1C in the last 72 hours. CBG: No results for input(s): GLUCAP in the last 168 hours. Lipid Profile: No results for input(s): CHOL, HDL, LDLCALC, TRIG, CHOLHDL, LDLDIRECT in the last 72 hours. Thyroid Function Tests: No results for input(s): TSH, T4TOTAL, FREET4, T3FREE, THYROIDAB in the last 72 hours. Anemia Panel: No results for input(s): VITAMINB12, FOLATE, FERRITIN, TIBC, IRON, RETICCTPCT in the last 72 hours. Urine analysis:    Component Value Date/Time   COLORURINE YELLOW 12/07/2019 2100   APPEARANCEUR CLEAR 12/07/2019 2100   LABSPEC 1.014 12/07/2019 2100   PHURINE 5.0 12/07/2019 2100   GLUCOSEU >=500 (A) 12/07/2019 2100   HGBUR SMALL (A) 12/07/2019 2100   BILIRUBINUR NEGATIVE 12/07/2019 2100   KETONESUR 80 (A) 12/07/2019 2100   PROTEINUR 30 (A) 12/07/2019 2100   UROBILINOGEN 0.2 08/29/2009 0952   NITRITE NEGATIVE 12/07/2019 2100   LEUKOCYTESUR  NEGATIVE 12/07/2019 2100    Radiological Exams on Admission: DG Chest 2 View  Result Date: 12/07/2019 CLINICAL DATA:  Shortness of breath EXAM: CHEST - 2 VIEW COMPARISON:  11/21/2015 chest radiograph. 02/24/2018 CT heart scoring. FINDINGS: Hypoinflated lungs. Multifocal peripheral and basilar predominant patchy opacities. No pneumothorax or pleural effusion. Cardiomediastinal silhouette within normal limits. No acute osseous abnormality. IMPRESSION: Multifocal pneumonia. Electronically Signed   By: Stana Bunting M.D.   On: 12/07/2019 17:44    EKG: Independently reviewed.  Sinus tachycardia, baseline wander.  QTc 486.  Assessment/Plan Principal Problem:   Pneumonia due to COVID-19 virus Active Problems:   Hyperlipidemia   Sepsis (HCC)   Acute hypoxemic respiratory failure (HCC)  Hypokalemia   Sepsis and acute hypoxemic respiratory failure secondary to COVID-19 viral multifocal pneumonia: Oxygen saturation 90% on room air, currently requiring 2 L supplemental oxygen.  Tested positive for Covid on 8/27 and repeat testing done here today also positive. Tachycardic and tachypneic. Lactic acid 2.0 >1.4. Chest x-ray showing multifocal peripheral and basilar predominant patchy opacities concerning for pneumonia. Bacterial pneumonia less likely given no leukocytosis. -Remdesivir -IV Solu-Medrol 0.5 mg/kg every 12 hours -Mildly tachycardic, order 1 L fluid bolus -Vitamin C, zinc, vitamin D -Antitussives as needed -Tylenol as needed -Bronchodilator as needed -Check inflammatory markers including ferritin, fibrinogen, D-dimer, CRP, LDH -Check procalcitonin level -Daily CBC with differential, CMP, CRP, D-dimer, LDH -Airborne and contact precautions -Incentive spirometry, flutter valve -Encourage prone positioning -Continuous pulse ox -Supplemental oxygen as needed to keep oxygen saturation above 90% -Blood culture x2 ordered  Hypokalemia: Likely due to poor oral intake. Potassium 2.7.  EKG with borderline QT prolongation. -Cardiac monitoring. Replete potassium. Check magnesium level and replete if low. Continue to monitor electrolytes. Repeat EKG in a.m.  Hyperlipidemia: Not on a statin. -Check lipid panel  Type 2 diabetes: Patient is not on any medications.  Random blood glucose 261. -Check A1c.  Sliding scale insulin sensitive ACHS and CBG checks.  DVT prophylaxis: Lovenox Code Status: Full code Family Communication: No family available at this time. Disposition Plan: Status is: Inpatient  Remains inpatient appropriate because:IV treatments appropriate due to intensity of illness or inability to take PO and Inpatient level of care appropriate due to severity of illness   Dispo: The patient is from: Home              Anticipated d/c is to: Home              Anticipated d/c date is: 3 days              Patient currently is not medically stable to d/c.  The medical decision making on this patient was of high complexity and the patient is at high risk for clinical deterioration, therefore this is a level 3 visit.  John Giovanni MD [facility]  If 7PM-7AM, please contact night-coverage [email or web address]  12/07/2019, 11:08 PM

## 2019-12-07 NOTE — Telephone Encounter (Signed)
Lvm asking pt to call back.  Need to get update on sxs.  

## 2019-12-07 NOTE — Telephone Encounter (Signed)
Noted Agree patient needs to be seen ASAP. ER is ideal if labored breathing.

## 2019-12-07 NOTE — ED Provider Notes (Signed)
[facility]    CSN: [id number] Arrival date & time: 12/07/19  1432      History   Chief Complaint Chief Complaint  Patient presents with  . Shortness of Breath    HPI Rudean Icenhour is a 54 y.o. female  With known COVID-19 infection (day 11/26/2018) presenting for worsening breathing, chest tightness, weakness, decreased appetite.  Patient presents to clinic in acute respiratory distress with tachypnea, tachycardia, hypoxia from baseline.  Denies active chest pain.  Afebrile.  Past Medical History:  Diagnosis Date  . Anemia   . Anxiety   . Gestational diabetes   . Hyperlipidemia     Patient Active Problem List   Diagnosis Date Noted  . COVID-19 virus infection 12/01/2019  . Chest pain with moderate risk for cardiac etiology 02/01/2018  . High cholesterol 01/15/2018  . Iron deficiency anemia 01/15/2018  . Menorrhagia with irregular cycle 01/15/2018  . Elevated blood pressure reading in office without diagnosis of hypertension 01/15/2018  . BMI 28.0-28.9,adult 01/15/2018  . Cough, persistent 01/15/2018  . Diabetes mellitus type 2, uncomplicated (HCC) 01/15/2018  . Family history of thyroid disease in mother 01/15/2018    Past Surgical History:  Procedure Laterality Date  . CESAREAN SECTION    . ceserean    . CHOLECYSTECTOMY      OB History   No obstetric history on file.      Home Medications    Prior to Admission medications   Medication Sig Start Date End Date Taking? Authorizing Provider  ELDERBERRY PO Take by mouth as needed.     [provider]  Flaxseed, Linseed, (FLAX SEEDS PO) Take 1 tablet by mouth daily as needed.    [provider]  GARLIC PO Take 1 tablet by mouth daily as needed.    [provider]  guaiFENesin-codeine (CHERATUSSIN AC) 100-10 MG/5ML syrup Take 5 mLs by mouth 2 (two) times daily as needed for cough (sedation precautions). 12/01/19   Eustaquio Boyden, MD  Multiple Vitamin (MULTIVITAMIN WITH  MINERALS) TABS tablet Take 1 tablet by mouth daily.    [provider]  Probiotic Product (PROBIOTIC PO) Take by mouth daily.    [provider]    Family History Family History  Problem Relation Age of Onset  . Diabetes Mother   . Alcohol abuse Father   . Heart disease Sister   . Breast cancer Neg Hx     Social History Social History   Tobacco Use  . Smoking status: Never Smoker  . Smokeless tobacco: Never Used  Substance Use Topics  . Alcohol use: No  . Drug use: No     Allergies   Patient has no known allergies.   Review of Systems As per HPI   Physical Exam Triage Vital Signs ED Triage Vitals  Enc Vitals Group     BP 12/07/19 1442 (!) 153/99     Pulse Rate 12/07/19 1442 (!) 121     Resp 12/07/19 1442 (!) 36     Temp 12/07/19 1442 97.8 F (36.6 C)     Temp Source 12/07/19 1442 Oral     SpO2 12/07/19 1442 90 %     Weight --      Height --      Head Circumference --      Peak Flow --      Pain Score 12/07/19 1454 5     Pain Loc --      Pain Edu? --  Excl. in GC? --    No data found.  Updated Vital Signs BP (!) 153/99 (BP Location: Left Arm)   Pulse (!) 121   Temp 97.8 F (36.6 C) (Oral)   Resp (!) 36   LMP 06/14/2017   SpO2 92%   Visual Acuity Right Eye Distance:   Left Eye Distance:   Bilateral Distance:    Right Eye Near:   Left Eye Near:    Bilateral Near:     Physical Exam Constitutional:      General: She is not in acute distress.    Appearance: She is well-developed. She is obese. She is ill-appearing.  HENT:     Head: Normocephalic and atraumatic.  Eyes:     General: No scleral icterus.    Pupils: Pupils are equal, round, and reactive to light.  Neck:     Vascular: No JVD.     Trachea: No tracheal deviation.  Cardiovascular:     Rate and Rhythm: Regular rhythm. Tachycardia present.  Pulmonary:     Effort: Tachypnea and respiratory distress present.     Breath sounds: No stridor. Decreased breath  sounds and rhonchi present.  Skin:    Coloration: Skin is not jaundiced or pale.  Neurological:     Mental Status: She is alert and oriented to person, place, and time.      UC Treatments / Results  Labs (all labs ordered are listed, but only abnormal results are displayed) Labs Reviewed - No data to display  EKG   Radiology No results found.  Procedures Procedures (including critical care time)  Medications Ordered in UC Medications - No data to display  Initial Impression / Assessment and Plan / UC Course  I have reviewed the triage vital signs and the nursing notes.  Pertinent labs & imaging results that were available during my care of the patient were reviewed by me and considered in my medical decision making (see chart for details).     Patient afebrile, though tachypneic, tachycardic, hypoxic from baseline.  Mildly hypotensive.  Has known COVID-19 infection with worsening respiratory status.  Recommended ER for further evaluation.  Patient electing EMS transport.   Final Clinical Impressions(s) / UC Diagnoses   Final diagnoses:  COVID-19 virus infection  SOB (shortness of breath)  Tachycardia  Tachypnea  Hypoxia   Discharge Instructions   None    ED Prescriptions    None     PDMP not reviewed this encounter.   Hall-Potvin, [place], [place] 12/07/19 1508

## 2019-12-07 NOTE — ED Provider Notes (Signed)
[facility]EMERGENCY DEPT Provider Note   CSN: [id number] Arrival date & time: 12/07/19  1553     History Chief Complaint  Patient presents with   Covid Positive    Judith Garcia is a 54 y.o. female.  HPI   Patient presents ED with complaints of worsening shortness of breath associated with known Covid infection.  Patient states she was diagnosed on a [facility] on August 27.  Patient has had worsening symptoms including cough, fever, fatigue, decreased appetite.  Patient has also started to feel more short of breath.  She went to an urgent care today and I know that she was hypoxic on room air.  She was sent to the ED for further evaluation.  Patient has not been vaccinated for COVID-19  Past Medical History:  Diagnosis Date   Anemia    Anxiety    Gestational diabetes    Hyperlipidemia     Patient Active Problem List   Diagnosis Date Noted   COVID-19 virus infection 12/01/2019   Chest pain with moderate risk for cardiac etiology 02/01/2018   High cholesterol 01/15/2018   Iron deficiency anemia 01/15/2018   Menorrhagia with irregular cycle 01/15/2018   Elevated blood pressure reading in office without diagnosis of hypertension 01/15/2018   BMI 28.0-28.9,adult 01/15/2018   Cough, persistent 01/15/2018   Diabetes mellitus type 2, uncomplicated (HCC) 01/15/2018   Family history of thyroid disease in mother 01/15/2018    Past Surgical History:  Procedure Laterality Date   CESAREAN SECTION     ceserean     CHOLECYSTECTOMY       OB History   No obstetric history on file.     Family History  Problem Relation Age of Onset   Diabetes Mother    Alcohol abuse Father    Heart disease Sister    Breast cancer Neg Hx     Social History   Tobacco Use   Smoking status: Never Smoker   Smokeless tobacco: Never Used  Substance Use Topics   Alcohol use: No   Drug use: No    Home Medications Prior to Admission medications     Medication Sig Start Date End Date Taking? Authorizing Provider  ELDERBERRY PO Take by mouth as needed.     [provider]  Flaxseed, Linseed, (FLAX SEEDS PO) Take 1 tablet by mouth daily as needed.    [provider]  GARLIC PO Take 1 tablet by mouth daily as needed.    [provider]  guaiFENesin-codeine (CHERATUSSIN AC) 100-10 MG/5ML syrup Take 5 mLs by mouth 2 (two) times daily as needed for cough (sedation precautions). 12/01/19   Eustaquio Boyden, MD  Multiple Vitamin (MULTIVITAMIN WITH MINERALS) TABS tablet Take 1 tablet by mouth daily.    [provider]  Probiotic Product (PROBIOTIC PO) Take by mouth daily.    [provider]    Allergies    Patient has no known allergies.  Review of Systems   Review of Systems  All other systems reviewed and are negative.   Physical Exam Updated Vital Signs BP 133/87    Pulse (!) 108    Temp 98.8 F (37.1 C) (Oral)    Resp (!) 35    LMP 06/14/2017    SpO2 99%   Physical Exam Vitals and nursing note reviewed.  Constitutional:      Appearance: She is well-developed. She is ill-appearing.  HENT:     Head: Normocephalic and atraumatic.     Right  Ear: External ear normal.     Left Ear: External ear normal.  Eyes:     General: No scleral icterus.       Right eye: No discharge.        Left eye: No discharge.     Conjunctiva/sclera: Conjunctivae normal.  Neck:     Trachea: No tracheal deviation.  Cardiovascular:     Rate and Rhythm: Regular rhythm. Tachycardia present.  Pulmonary:     Effort: Tachypnea present. No respiratory distress.     Breath sounds: Normal breath sounds. No stridor. No wheezing or rales.  Abdominal:     General: Bowel sounds are normal. There is no distension.     Palpations: Abdomen is soft.     Tenderness: There is no abdominal tenderness. There is no guarding or rebound.  Musculoskeletal:        General: No tenderness.     Cervical back: Neck supple.  Skin:     General: Skin is warm and dry.     Findings: No rash.  Neurological:     Mental Status: She is alert.     Cranial Nerves: No cranial nerve deficit (no facial droop, extraocular movements intact, no slurred speech).     Sensory: No sensory deficit.     Motor: No abnormal muscle tone or seizure activity.     Coordination: Coordination normal.     ED Results / Procedures / Treatments   Labs (all labs ordered are listed, but only abnormal results are displayed) Labs Reviewed  LACTIC ACID, PLASMA - Abnormal; Notable for the following components:      Result Value   Lactic Acid, Venous 2.0 (*)    All other components within normal limits  COMPREHENSIVE METABOLIC PANEL - Abnormal; Notable for the following components:   Sodium 134 (*)    Potassium 2.7 (*)    Chloride 93 (*)    Glucose, Bld 261 (*)    Anion gap 18 (*)    All other components within normal limits  CBC WITH DIFFERENTIAL/PLATELET - Abnormal; Notable for the following components:   RBC 5.89 (*)    MCV 70.1 (*)    MCH 23.6 (*)    RDW 19.3 (*)    Platelets 434 (*)    Abs Immature Granulocytes 0.08 (*)    All other components within normal limits  SARS CORONAVIRUS 2 BY RT PCR (HOSPITAL ORDER, PERFORMED IN [facility])  LACTIC ACID, PLASMA  URINALYSIS, ROUTINE W REFLEX MICROSCOPIC    EKG EKG Interpretation  Date/Time:  Tuesday December 07 2019 16:13:48 EDT Ventricular Rate:  120 PR Interval:  130 QRS Duration: 70 QT Interval:  344 QTC Calculation: 486 R Axis:   25 Text Interpretation: Sinus tachycardia Cannot rule out Inferior infarct , age undetermined Abnormal ECG Since last tracing rate faster Confirmed by Linwood Dibbles [phone]) on 12/07/2019 4:20:12 PM   Radiology DG Chest 2 View  Result Date: 12/07/2019 CLINICAL DATA:  Shortness of breath EXAM: CHEST - 2 VIEW COMPARISON:  11/21/2015 chest radiograph. 02/24/2018 CT heart scoring. FINDINGS: Hypoinflated lungs. Multifocal peripheral and basilar  predominant patchy opacities. No pneumothorax or pleural effusion. Cardiomediastinal silhouette within normal limits. No acute osseous abnormality. IMPRESSION: Multifocal pneumonia. Electronically Signed   By: Stana Bunting M.D.   On: 12/07/2019 17:44    Procedures .Critical Care Performed by: Linwood Dibbles, MD Authorized by: Linwood Dibbles, MD   Critical care provider statement:    Critical care time (minutes):  30  Critical care was time spent personally by me on the following activities:  Discussions with consultants, evaluation of patient's response to treatment, examination of patient, ordering and performing treatments and interventions, ordering and review of laboratory studies, ordering and review of radiographic studies, pulse oximetry, re-evaluation of patient's condition, obtaining history from patient or surrogate and review of old charts   (including critical care time)  Medications Ordered in ED Medications  remdesivir 200 mg in sodium chloride 0.9% 250 mL IVPB (200 mg Intravenous New Bag/Given 12/07/19 1928)    Followed by  remdesivir 100 mg in sodium chloride 0.9 % 100 mL IVPB (has no administration in time range)  potassium chloride 10 mEq in 100 mL IVPB (has no administration in time range)  dexamethasone (DECADRON) injection 6 mg (6 mg Intravenous Given 12/07/19 1926)    ED Course  I have reviewed the triage vital signs and the nursing notes.  Pertinent labs & imaging results that were available during my care of the patient were reviewed by me and considered in my medical decision making (see chart for details).  Clinical Course as of Dec 06 2012  Tue Dec 07, 2019  2013 CBC is normal.  Metabolic panel shows hypokalemia and hyperglycemia.  Lactic acid level slightly elevated   [JK]    Clinical Course User Index [JK] Linwood Dibbles, MD   MDM Rules/Calculators/A&P                         Judith Garcia was evaluated in Emergency Department on 12/07/2019 for the symptoms  described in the history of present illness. She was evaluated in the context of the global COVID-19 pandemic, which necessitated consideration that the patient might be at risk for infection with the SARS-CoV-2 virus that causes COVID-19. Institutional protocols and algorithms that pertain to the evaluation of patients at risk for COVID-19 are in a state of rapid change based on information released by regulatory bodies including the CDC and federal and state organizations. These policies and algorithms were followed during the patient's care in the ED.  Patient has evidence of multifocal pneumonia on chest x-ray.  She does have a new oxygen requirement.  Patient has been started on remdesivir and Decadron.  I will consult the medical service for admission. Final Clinical Impression(s) / ED Diagnoses Final diagnoses:  Pneumonia due to COVID-19 virus      Linwood Dibbles, MD 12/07/19 2015

## 2019-12-07 NOTE — ED Triage Notes (Signed)
Pt here for SOB post covid diagnosis on 8/27; pt sts increased SOB and weakness at home with decreased appetite

## 2019-12-07 NOTE — ED Triage Notes (Signed)
Per EMS-states patient was at [facility]-sent to ED for low O2-placed on 3L [place] 96%-covid positive on 8/27-patient complaining of fatigue and SOB

## 2019-12-08 DIAGNOSIS — U071 COVID-19: Secondary | ICD-10-CM | POA: Diagnosis not present

## 2019-12-08 DIAGNOSIS — J1282 Pneumonia due to coronavirus disease 2019: Secondary | ICD-10-CM | POA: Diagnosis not present

## 2019-12-08 LAB — C-REACTIVE PROTEIN
CRP: 9.1 mg/dL — ABNORMAL HIGH (ref <1.0)
CRP: 9.1 mg/dL — ABNORMAL HIGH (ref <1.0)

## 2019-12-08 LAB — CBC WITH DIFFERENTIAL/PLATELET
Abs Immature Granulocytes: 0.1 10*3/uL — ABNORMAL HIGH (ref 0.00–0.07)
Basophils Absolute: 0 10*3/uL (ref 0.0–0.1)
Basophils Relative: 0 %
Eosinophils Absolute: 0 10*3/uL (ref 0.0–0.5)
Eosinophils Relative: 0 %
HCT: 39.6 % (ref 36.0–46.0)
Hemoglobin: 12.8 g/dL (ref 12.0–15.0)
Immature Granulocytes: 2 %
Lymphocytes Relative: 18 %
Lymphs Abs: 1 10*3/uL (ref 0.7–4.0)
MCH: 23.6 pg — ABNORMAL LOW (ref 26.0–34.0)
MCHC: 32.3 g/dL (ref 30.0–36.0)
MCV: 73.1 fL — ABNORMAL LOW (ref 80.0–100.0)
Monocytes Absolute: 0.2 10*3/uL (ref 0.1–1.0)
Monocytes Relative: 4 %
Neutro Abs: 4.4 10*3/uL (ref 1.7–7.7)
Neutrophils Relative %: 76 %
Platelets: 461 10*3/uL — ABNORMAL HIGH (ref 150–400)
RBC: 5.42 MIL/uL — ABNORMAL HIGH (ref 3.87–5.11)
RDW: 19.4 % — ABNORMAL HIGH (ref 11.5–15.5)
WBC: 5.8 10*3/uL (ref 4.0–10.5)
nRBC: 0 % (ref 0.0–0.2)

## 2019-12-08 LAB — COMPREHENSIVE METABOLIC PANEL
ALT: 16 U/L (ref 0–44)
AST: 15 U/L (ref 15–41)
Albumin: 3.2 g/dL — ABNORMAL LOW (ref 3.5–5.0)
Alkaline Phosphatase: 75 U/L (ref 38–126)
Anion gap: 13 (ref 5–15)
BUN: 9 mg/dL (ref 6–20)
CO2: 23 mmol/L (ref 22–32)
Calcium: 9.3 mg/dL (ref 8.9–10.3)
Chloride: 101 mmol/L (ref 98–111)
Creatinine, Ser: 0.72 mg/dL (ref 0.44–1.00)
GFR calc Af Amer: >60 mL/min (ref >60)
GFR calc non Af Amer: >60 mL/min (ref >60)
Glucose, Bld: 296 mg/dL — ABNORMAL HIGH (ref 70–99)
Potassium: 3.9 mmol/L (ref 3.5–5.1)
Sodium: 137 mmol/L (ref 135–145)
Total Bilirubin: 0.9 mg/dL (ref 0.3–1.2)
Total Protein: 7.2 g/dL (ref 6.5–8.1)

## 2019-12-08 LAB — GLUCOSE, CAPILLARY
Glucose-Capillary: 323 mg/dL — ABNORMAL HIGH (ref 70–99)
Glucose-Capillary: 265 mg/dL — ABNORMAL HIGH (ref 70–99)
Glucose-Capillary: 332 mg/dL — ABNORMAL HIGH (ref 70–99)

## 2019-12-08 LAB — PROCALCITONIN: Procalcitonin: <0.1 ng/mL

## 2019-12-08 LAB — LIPID PANEL
Cholesterol: 206 mg/dL — ABNORMAL HIGH (ref 0–200)
HDL: 29 mg/dL — ABNORMAL LOW (ref >40)
LDL Cholesterol: 139 mg/dL — ABNORMAL HIGH (ref 0–99)
Total CHOL/HDL Ratio: 7.1 RATIO
Triglycerides: 188 mg/dL — ABNORMAL HIGH (ref <150)
VLDL: 38 mg/dL (ref 0–40)

## 2019-12-08 LAB — FIBRINOGEN: Fibrinogen: 739 mg/dL — ABNORMAL HIGH (ref 210–475)

## 2019-12-08 LAB — CBG MONITORING, ED
Glucose-Capillary: 282 mg/dL — ABNORMAL HIGH (ref 70–99)
Glucose-Capillary: 373 mg/dL — ABNORMAL HIGH (ref 70–99)

## 2019-12-08 LAB — D-DIMER, QUANTITATIVE
D-Dimer, Quant: 0.88 ug/mL-FEU — ABNORMAL HIGH (ref 0.00–0.50)
D-Dimer, Quant: 1.15 ug/mL-FEU — ABNORMAL HIGH (ref 0.00–0.50)

## 2019-12-08 LAB — FERRITIN: Ferritin: 114 ng/mL (ref 11–307)

## 2019-12-08 LAB — HIV ANTIBODY (ROUTINE TESTING W REFLEX): HIV Screen 4th Generation wRfx: NONREACTIVE

## 2019-12-08 MED ORDER — SODIUM CHLORIDE 0.9 % IV SOLN: 1.0000 g | INTRAVENOUS | Status: DC

## 2019-12-08 MED ORDER — ADULT MULTIVITAMIN W/MINERALS CH
1.0000 | ORAL_TABLET | Freq: Every day | ORAL | Status: DC
  Administered 2019-12-08 – 2019-12-09 (×2): 1 via ORAL
  Filled 2019-12-08 (×2): qty 1

## 2019-12-08 NOTE — Progress Notes (Signed)
Judith Garcia  FHL:[id number] DOB: 04/17/1965 DOA: 12/07/2019 PCP: Excell Seltzer, MD    Brief Narrative:  54 year old with a history of anxiety disorder and HLD who presented to the ED via EMS from a local urgent care with fatigue and shortness of breath.  Saturations were 90% on room air at the urgent care.  She was known to have tested positive for Cov[facility] 8/27 at a local [facility].  She has not been vaccinated.  She reports her symptoms first began 8/24.  In the ED she required 2 L of supplemental oxygen support.  A Covid PCR was confirmed to be positive.  CXR noted multifocal peripheral and basilar predominant patchy opacities concerning for Covid pneumonia.  Significant Events:  8/24 symptom onset 8/27+ Covid test as outpatient 9/27 admit via [facility], ED  Date of Positive COVID Test:  8/27  Vaccination Status: Not vaccinated  COVID-19 specific Treatment: Remdesivir 9/7 > Steroid 9/7 >  Antimicrobials:  None  DVT prophylaxis: Lovenox  Subjective: The patient feels that she is improving already.  She appears clinically stable at the time of my exam.  She reports some ongoing mild shortness of breath.  She has not yet attempted to ambulate.  She denies nausea vomiting or abdominal pain.  Assessment & Plan:  COVID Pneumonia -acute hypoxic respiratory failure Continue remdesivir plus steroid -ambulate -incentive spirometer -assess oxygen saturations with ambulation -may be a candidate for early discharge  Recent Labs  Lab 12/07/19 1819 12/07/19 2310 12/08/19 0008 12/08/19 0540  DDIMER  --  0.88*  --  1.15*  FERRITIN  --   --  114  --   CRP  --   --  9.1* 9.1*  ALT 18  --   --  16  PROCALCITON  --   --  <0.10  --     Hypokalemia Corrected with supplementation  HLD Continue usual home medical therapy and follow LFTs  DM 2 uncontrolled with steroid-induced hyperglycemia Adjust medical therapy in setting of ongoing steroid use and monitor closely   Code Status:  FULL CODE Family Communication: No family present at time of exam Status is: Inpatient  Remains inpatient appropriate because:Inpatient level of care appropriate due to severity of illness   Dispo: The patient is from: Home              Anticipated d/c is to: Home              Anticipated d/c date is: 2 days              Patient currently is not medically stable to d/c.  Consultants:  none  Objective: Blood pressure 133/90, pulse 92, temperature 98 F (36.7 C), temperature source Oral, resp. rate (!) 29, height 5\' 3"  (1.6 m), weight 67.8 kg, last menstrual period 06/14/2017, SpO2 96 %.  Intake/Output Summary (Last 24 hours) at 12/08/2019 1514 Last data filed at 12/08/2019 1505 Gross per 24 hour  Intake 1800.83 ml  Output --  Net 1800.83 ml   Filed Weights   12/08/19 0856  Weight: 67.8 kg    Examination: General: No acute respiratory distress Lungs: Fine diffuse pulmonary crackles with no wheezing Cardiovascular: Regular rate and rhythm without murmur gallop or rub normal S1 and S2 Abdomen: Nontender, nondistended, soft, bowel sounds positive, no rebound, no ascites, no appreciable mass Extremities: No significant cyanosis, clubbing, or edema bilateral lower extremities  CBC: Recent Labs  Lab 12/07/19 1819 12/08/19 0540  WBC 8.7 5.8  NEUTROABS  6.2 4.4  HGB 13.9 12.8  HCT 41.3 39.6  MCV 70.1* 73.1*  PLT 434* 461*   Basic Metabolic Panel: Recent Labs  Lab 12/07/19 1819 12/07/19 2310 12/08/19 0540  NA 134*  --  137  K 2.7*  --  3.9  CL 93*  --  101  CO2 23  --  23  GLUCOSE 261*  --  296*  BUN 9  --  9  CREATININE 0.73  --  0.72  CALCIUM 9.2  --  9.3  MG  --  2.0  --    GFR: Estimated Creatinine Clearance: 74.4 mL/min (by C-G formula based on SCr of 0.72 mg/dL).  Liver Function Tests: Recent Labs  Lab 12/07/19 1819 12/08/19 0540  AST 19 15  ALT 18 16  ALKPHOS 81 75  BILITOT 1.1 0.9  PROT 7.8 7.2  ALBUMIN 3.5 3.2*    HbA1C: Hgb A1c MFr Bld   Date/Time Value Ref Range Status  05/26/2018 12:34 PM 6.4 (H) <5.7 % of total Hgb Final    Comment:    For someone without known diabetes, a hemoglobin  A1c value between 5.7% and 6.4% is consistent with prediabetes and should be confirmed with a  follow-up test. . For someone with known diabetes, a value <7% indicates that their diabetes is well controlled. A1c targets should be individualized based on duration of diabetes, age, comorbid conditions, and other considerations. . This assay result is consistent with an increased risk of diabetes. . Currently, no consensus exists regarding use of hemoglobin A1c for diagnosis of diabetes for children. .   01/15/2018 01:00 PM 7.1 (H) 4.6 - 6.5 % Final    Comment:    Glycemic Control Guidelines for People with Diabetes:Non Diabetic:  <6%Goal of Therapy: <7%Additional Action Suggested:  >8%     CBG: Recent Labs  Lab 12/08/19 0003 12/08/19 0819 12/08/19 1200  GLUCAP 282* 373* 323*    Recent Results (from the past 240 hour(s))  SARS Coronavirus 2 by RT PCR (hospital order, performed in [facility] hospital lab) Nasopharyngeal Nasopharyngeal Swab     Status: Abnormal   Collection Time: 12/07/19  6:17 PM   Specimen: Nasopharyngeal Swab  Result Value Ref Range Status   SARS Coronavirus 2 POSITIVE (A) NEGATIVE Final    Comment: RESULT CALLED TO, READ BACK BY AND VERIFIED WITH: ILENE HODGES @ 2111 ON 12/07/19 C VARNER (NOTE) SARS-CoV-2 target nucleic acids are DETECTED  SARS-CoV-2 RNA is generally detectable in upper respiratory specimens  during the acute phase of infection.  Positive results are indicative  of the presence of the identified virus, but do not rule out bacterial infection or co-infection with other pathogens not detected by the test.  Clinical correlation with patient history and  other diagnostic information is necessary to determine patient infection status.  The expected result is negative.  Fact Sheet for  Patients:   BoilerBrush.com.cy   Fact Sheet for Healthcare Providers:   https://pope.com/    This test is not yet approved or cleared by the [place] FDA and  has been authorized for detection and/or diagnosis of SARS-CoV-2 by FDA under an Emergency Use Authorization (EUA).  This EUA will remain in effect (meaning thi s test can be used) for the duration of  the COVID-19 declaration under Section 564(b)(1) of the Act, 21 U.S.C. section 360-bbb-3(b)(1), unless the authorization is terminated or revoked sooner.  Performed at [facility], [address] [place], [place] [address]  Scheduled Meds: . vitamin C  500 mg Oral Daily  . cholecalciferol  1,000 Units Oral Daily  . enoxaparin (LOVENOX) injection  40 mg Subcutaneous QHS  . insulin aspart  0-5 Units Subcutaneous QHS  . insulin aspart  0-9 Units Subcutaneous TID WC  . methylPREDNISolone (SOLU-MEDROL) injection  0.5 mg/kg Intravenous Q12H   Followed by  . [START ON 12/11/2019] predniSONE  50 mg Oral Daily  . multivitamin with minerals  1 tablet Oral Daily  . zinc sulfate  220 mg Oral Daily   Continuous Infusions: . remdesivir 100 mg in NS 100 mL 100 mg (12/08/19 1215)     LOS: 1 day   Lonia Blood, MD [facility] Office  [phone] Pager - Text Page per Loretha Stapler  If 7PM-7AM, please contact night-coverage per Amion 12/08/2019, 3:14 PM

## 2019-12-08 NOTE — Telephone Encounter (Signed)
Spoke with pt asking for an update.  States she is currently at [employer] [facility].  She was seen at [facility] yesterday and was transported to [facility] by ambulance.  After CXR, told she has pneumonia in both lungs.  Has had 2 antibody infusions.  Given steroids, zinc, vit C and vit D.

## 2019-12-08 NOTE — Progress Notes (Signed)
   12/08/19 0856  Assess: MEWS Score  Temp 97.8 F (36.6 C)  BP 136/89  Pulse Rate 84  Level of Consciousness Alert  SpO2 95 %  O2 Device Nasal Cannula  O2 Flow Rate (L/min) 2 L/min  Assess: MEWS Score  MEWS Temp 0  MEWS Systolic 0  MEWS Pulse 0  MEWS RR 2  MEWS LOC 0  MEWS Score 2  MEWS Score Color Yellow  Assess: if the MEWS score is Yellow or Red  Were vital signs taken at a resting state? Yes  Focused Assessment No change from prior assessment  Early Detection of Sepsis Score *See Row Information* Low  MEWS guidelines implemented *See Row Information* No, previously yellow, continue vital signs every 4 hours  Treat  Pain Scale 0-10  Pain Score 0  Escalate  MEWS: Escalate Yellow: discuss with charge nurse/RN and consider discussing with provider and RRT  Notify: Charge Nurse/RN  Name of Charge Nurse/RN Notified Marissa  Date Charge Nurse/RN Notified 12/08/19  Pt Covid + on 2L O2 sat 95%

## 2019-12-08 NOTE — Telephone Encounter (Signed)
Noted.  She did not have antibody infusion. She is currently receiving remdesivir and decadron.

## 2019-12-08 NOTE — ED Notes (Signed)
Report given to Pam, RN.

## 2019-12-08 NOTE — Progress Notes (Signed)
Inpatient Diabetes Program Recommendations  AACE/ADA: New Consensus Statement on Inpatient Glycemic Control (2015)  Target Ranges:  Prepandial:   less than 140 mg/dL      Peak postprandial:   less than 180 mg/dL (1-2 hours)      Critically ill patients:  140 - 180 mg/dL   Lab Results  Component Value Date   GLUCAP 373 (H) 12/08/2019   HGBA1C 6.4 (H) 05/26/2018    Review of Glycemic Control Results for Judith Garcia, Judith Garcia (MRN [id number]) as of 12/08/2019 11:33  Ref. Range 12/08/2019 00:03 12/08/2019 08:19  Glucose-Capillary Latest Ref Range: 70 - 99 mg/dL 403 (H) 754 (H)   Diabetes history: DM 2 Outpatient Diabetes medications: none Current orders for Inpatient glycemic control:  Novolog 0-9 units tid + hs  Solumedrol 34.8 mg Q12 hours  Inpatient Diabetes Program Recommendations:    COVID glycemic control order set  -  Levemir 5 units bid -  Tradjenta 5 mg Daily  Thanks,  Christena Deem RN, MSN, BC-ADM Inpatient Diabetes Coordinator Team Pager [phone] (8a-5p) '

## 2019-12-08 NOTE — Progress Notes (Signed)
Initial Nutrition Assessment  RD working remotely.  DOCUMENTATION CODES:   Not applicable  INTERVENTION:  - will order Hormel Shake with breakfast meals, each supplement provides 500 kcal and 22 grams protein. - will order 1 tablet multivitamin with minerals/day.   NUTRITION DIAGNOSIS:   Increased nutrient needs related to acute illness, catabolic illness (COVID-19 infection) as evidenced by estimated needs  GOAL:   Patient will meet greater than or equal to 90% of their needs  MONITOR:   PO intake, Supplement acceptance, Labs, Weight trends  REASON FOR ASSESSMENT:   Malnutrition Screening Tool  ASSESSMENT:   54 y.o. female with medical history of anxiety and hyperlipidemia (patient told ED staff that she has type 2 DM but that she has never been on medication for this). She presented to the ED via EMS from Urgent Care with complaints of fatigue and SOB. She tested positive for COVID on 11/26/19. She has not been vaccinated against COVID. Her symptoms started on 8/24: generalized weakness, chills, cough, SOB, and poor oral intake. She was previously having diarrhea which has now resolved.  No intakes documented since patient moved from 4W from the ED earlier today. Weight today is 149 lb and weight on 9/1 was 153 lb. This indicates 4 lb weight loss (2.6% body weight) in the past 1 week; significant for time frame. Suspect a combination of patient's reported poor intakes and hyperglycemia.   Per notes: - sepsis - COVID-19 PNA--proning being encouraged - type 2 MD with plan to check HgbA1c this admission   Labs reviewed; CBGs: 282, 373, 323 mg/dl/ Medications reviewed; 500 mg ascorbic acid/day, 1000 units cholecalciferol/day, sliding scale novolog, 34.8 mg solu-medrol/day x3 days (9/8-9/10), 10 mEq IV KCl x3 runs 9/7, 40 mEq Klor-Con x1 dose 9/7, 200 mg IV remdesivir x1 dose 9/7, 100 mg IV remdesivir x1 dose/day x4 days (9/8-9/11), 220 mg zinc sulfate/day.     NUTRITION -  FOCUSED PHYSICAL EXAM:  unable to complete at this time.   Diet Order:   Diet Order            Diet Heart Room service appropriate? Yes; Fluid consistency: Thin  Diet effective now                 EDUCATION NEEDS:   No education needs have been identified at this time  Skin:  Skin Assessment: Reviewed RN Assessment  Last BM:  unknown  Height:   Ht Readings from Last 1 Encounters:  12/08/19 5\' 3"  (1.6 m)    Weight:   Wt Readings from Last 1 Encounters:  12/08/19 67.8 kg     Estimated Nutritional Needs:  Kcal:  02/07/20 kcal Protein:  80-90 grams Fluid:  >/= 1.7 L/day     [phone], MS, RD, LDN, CNSC Inpatient Clinical Dietitian RD pager # available in AMION  After hours/weekend pager # available in [facility]

## 2019-12-09 DIAGNOSIS — U071 COVID-19: Secondary | ICD-10-CM | POA: Diagnosis not present

## 2019-12-09 DIAGNOSIS — J1282 Pneumonia due to coronavirus disease 2019: Secondary | ICD-10-CM | POA: Diagnosis not present

## 2019-12-09 LAB — CBC WITH DIFFERENTIAL/PLATELET
Abs Immature Granulocytes: 0.15 10*3/uL — ABNORMAL HIGH (ref 0.00–0.07)
Basophils Absolute: 0 10*3/uL (ref 0.0–0.1)
Basophils Relative: 0 %
Eosinophils Absolute: 0 10*3/uL (ref 0.0–0.5)
Eosinophils Relative: 0 %
HCT: 37.4 % (ref 36.0–46.0)
Hemoglobin: 12.2 g/dL (ref 12.0–15.0)
Immature Granulocytes: 2 %
Lymphocytes Relative: 14 %
Lymphs Abs: 1.1 10*3/uL (ref 0.7–4.0)
MCH: 23.6 pg — ABNORMAL LOW (ref 26.0–34.0)
MCHC: 32.6 g/dL (ref 30.0–36.0)
MCV: 72.3 fL — ABNORMAL LOW (ref 80.0–100.0)
Monocytes Absolute: 0.4 10*3/uL (ref 0.1–1.0)
Monocytes Relative: 4 %
Neutro Abs: 6.6 10*3/uL (ref 1.7–7.7)
Neutrophils Relative %: 80 %
Platelets: 486 10*3/uL — ABNORMAL HIGH (ref 150–400)
RBC: 5.17 MIL/uL — ABNORMAL HIGH (ref 3.87–5.11)
RDW: 19 % — ABNORMAL HIGH (ref 11.5–15.5)
WBC: 8.3 10*3/uL (ref 4.0–10.5)
nRBC: 0 % (ref 0.0–0.2)

## 2019-12-09 LAB — COMPREHENSIVE METABOLIC PANEL
ALT: 14 U/L (ref 0–44)
AST: 12 U/L — ABNORMAL LOW (ref 15–41)
Albumin: 2.8 g/dL — ABNORMAL LOW (ref 3.5–5.0)
Alkaline Phosphatase: 72 U/L (ref 38–126)
Anion gap: 9 (ref 5–15)
BUN: 13 mg/dL (ref 6–20)
CO2: 23 mmol/L (ref 22–32)
Calcium: 8.8 mg/dL — ABNORMAL LOW (ref 8.9–10.3)
Chloride: 102 mmol/L (ref 98–111)
Creatinine, Ser: 0.6 mg/dL (ref 0.44–1.00)
GFR calc Af Amer: >60 mL/min (ref >60)
GFR calc non Af Amer: >60 mL/min (ref >60)
Glucose, Bld: 360 mg/dL — ABNORMAL HIGH (ref 70–99)
Potassium: 4 mmol/L (ref 3.5–5.1)
Sodium: 134 mmol/L — ABNORMAL LOW (ref 135–145)
Total Bilirubin: 0.3 mg/dL (ref 0.3–1.2)
Total Protein: 6.5 g/dL (ref 6.5–8.1)

## 2019-12-09 LAB — D-DIMER, QUANTITATIVE: D-Dimer, Quant: 0.6 ug/mL-FEU — ABNORMAL HIGH (ref 0.00–0.50)

## 2019-12-09 LAB — HEMOGLOBIN A1C
Hgb A1c MFr Bld: 9.4 % — ABNORMAL HIGH (ref 4.8–5.6)
Mean Plasma Glucose: 223 mg/dL

## 2019-12-09 LAB — GLUCOSE, CAPILLARY
Glucose-Capillary: 342 mg/dL — ABNORMAL HIGH (ref 70–99)
Glucose-Capillary: 340 mg/dL — ABNORMAL HIGH (ref 70–99)

## 2019-12-09 LAB — C-REACTIVE PROTEIN: CRP: 2.9 mg/dL — ABNORMAL HIGH (ref <1.0)

## 2019-12-09 LAB — FERRITIN: Ferritin: 101 ng/mL (ref 11–307)

## 2019-12-09 MED ORDER — PREDNISONE 20 MG PO TABS: 40.0000 mg | ORAL_TABLET | Freq: Every day | ORAL | Status: DC

## 2019-12-09 MED ORDER — ACETAMINOPHEN 325 MG PO TABS: 650.0000 mg | ORAL_TABLET | Freq: Four times a day (QID) | ORAL | Status: AC | PRN

## 2019-12-09 MED ORDER — PREDNISONE 10 MG PO TABS: 40.0000 mg | ORAL_TABLET | Freq: Every day | ORAL | 0 refills | Status: AC

## 2019-12-09 MED ORDER — PREDNISONE 20 MG PO TABS: 20.0000 mg | ORAL_TABLET | Freq: Every day | ORAL | Status: DC

## 2019-12-09 MED ORDER — PREDNISONE 10 MG PO TABS: 10.0000 mg | ORAL_TABLET | Freq: Every day | ORAL | 0 refills | Status: AC

## 2019-12-09 MED ORDER — PREDNISONE 10 MG PO TABS: 10.0000 mg | ORAL_TABLET | Freq: Every day | ORAL | Status: DC

## 2019-12-09 MED ORDER — METFORMIN HCL ER 750 MG PO TB24: 750.0000 mg | ORAL_TABLET | Freq: Every day | ORAL | 11 refills | Status: DC

## 2019-12-09 MED ORDER — PREDNISONE 10 MG PO TABS: 20.0000 mg | ORAL_TABLET | Freq: Every day | ORAL | 0 refills | Status: AC

## 2019-12-09 MED ORDER — PREDNISONE 50 MG PO TABS
50.0000 mg | ORAL_TABLET | Freq: Once | ORAL | Status: AC
  Administered 2019-12-09: 50 mg via ORAL
  Filled 2019-12-09: qty 1

## 2019-12-09 NOTE — Discharge Summary (Addendum)
DISCHARGE SUMMARY  Judith Garcia  MR#: [id number]  DOB:1965/10/19  Date of Admission: 12/07/2019 Date of Discharge: 12/09/2019  Attending Physician:Rosabel Sermeno Silvestre Gunner, MD  Patient's NWG:NFAOZHY, Luberta Robertson, MD  Consults: none   Disposition: D/C home   Date of Positive COVID Test: 11/26/19  Date Quarantine Ends: 12/17/19 (21 days)  COVID-19 specific Treatment: Remdesivir 9/7 > 9/9 Steroid 9/7 > 9/16  Follow-up Appts:  Follow-up Information    Bedsole, Amy E, MD Follow up in 7 day(s).   Specialty: Family Medicine Contact information: [address] [place] [address] [phone]               Tests Needing Follow-up: -assess CBG control - consider adding other therapeutic agents -close attention will need to be paid to her diabetes management in the future once she is off steroids as it appears she has likely been uncontrolled for a prolonged period of time  Discharge Diagnoses: COVID Pneumonia Acute hypoxic respiratory failure Hypokalemia HLD DM 2 uncontrolled with steroid-induced hyperglycemia Sepsis (mentioned at admit) ruled out  Initial presentation: 54 year old with a history of anxiety disorder and HLD who presented to the ED via EMS from a local urgent care with fatigue and shortness of breath.  Saturations were 90% on room air at the urgent care.  She was known to have [facility]  She had not been vaccinated.  She reported her symptoms first began 8/24.  In the ED she required 2 L of supplemental oxygen support.  A Covid PCR was confirmed to be positive.  CXR noted multifocal peripheral and basilar predominant patchy opacities concerning for Covid pneumonia.  Hospital Course: 8/24 symptom onset 8/27+ Covid test as outpatient 9/7 admit via [facility] ED 9/9 D/C home   COVID Pneumonia -acute hypoxic respiratory failure Completed 3 days of remdesivir -given rapid improvement continuation of outpatient  remdesivir not felt to be indicated -complete 10 days of steroid treatment with weaning of dose beginning at time of discharge -patient advised to continue to ambulate at home and to avoid prolonged recumbent position except for when sleeping at night - incentive spirometer  use encouraged very frequently throughout the day -oxygen saturations remained at 88% or greater even with exertion while on room air on date of discharge therefore home oxygen not required -patient advised to return to the ED should she develop severe shortness of breath or other worrisome signs of progressive infection -patient counseled on need to continue to isolate at home with date of isolation discontinuation noted above and provided to patient in discharge instructions  Recent Labs  Lab 12/07/19 1819 12/07/19 2310 12/08/19 0008 12/08/19 0540 12/09/19 0442  DDIMER  --  0.88*  --  1.15* 0.60*  FERRITIN  --   --  114  --  101  CRP  --   --  9.1* 9.1* 2.9*  ALT 18  --   --  16 14  PROCALCITON  --   --  <0.10  --   --      Sepsis (mentioned at admit) ruled out  Hypokalemia Corrected with supplementation  HLD Continue usual home medical therapy  DM 2 uncontrolled with steroid-induced hyperglycemia The patient does not appear to be on medical therapy for this at home -an A1c was noted to be 9.4 -long-acting Metformin was initiated at time of discharge and steroids are being tapered -ongoing follow-up of this issue will be required as the patient is likely require  additional medical management to control her diabetes in the future  Allergies as of 12/09/2019   No Known Allergies     Medication List    TAKE these medications   acetaminophen 325 MG tablet Commonly known as: TYLENOL Take 2 tablets (650 mg total) by mouth every 6 (six) hours as needed for mild pain or headache (fever >/= 101).   Cheratussin AC 100-10 MG/5ML syrup Generic drug: guaiFENesin-codeine Take 5 mLs by mouth 2 (two) times daily as  needed for cough (sedation precautions).   metFORMIN 750 MG 24 hr tablet Commonly known as: Glucophage XR Take 1 tablet (750 mg total) by mouth daily with breakfast.   predniSONE 10 MG tablet Commonly known as: DELTASONE Take 4 tablets (40 mg total) by mouth daily with breakfast for 2 days. Start taking on: December 10, 2019   predniSONE 10 MG tablet Commonly known as: DELTASONE Take 2 tablets (20 mg total) by mouth daily with breakfast for 2 days. Start taking on: December 12, 2019   predniSONE 10 MG tablet Commonly known as: DELTASONE Take 1 tablet (10 mg total) by mouth daily with breakfast for 3 days. Start taking on: December 14, 2019       Day of Discharge BP 120/77 (BP Location: Left Arm)   Pulse 70   Temp 97.6 F (36.4 C) (Oral)   Resp (!) 22   Ht 5\' 3"  (1.6 m)   Wt 67.8 kg   LMP 06/14/2017   SpO2 99%   BMI 26.48 kg/m   Physical Exam: General: No acute respiratory distress Lungs: Clear to auscultation bilaterally without wheezes or crackles Cardiovascular: Regular rate and rhythm without murmur gallop or rub normal S1 and S2 Abdomen: Nontender, nondistended, soft, bowel sounds positive, no rebound, no ascites, no appreciable mass Extremities: No significant cyanosis, clubbing, or edema bilateral lower extremities  Basic Metabolic Panel: Recent Labs  Lab 12/07/19 1819 12/07/19 2310 12/08/19 0540 12/09/19 0442  NA 134*  --  137 134*  K 2.7*  --  3.9 4.0  CL 93*  --  101 102  CO2 23  --  23 23  GLUCOSE 261*  --  296* 360*  BUN 9  --  9 13  CREATININE 0.73  --  0.72 0.60  CALCIUM 9.2  --  9.3 8.8*  MG  --  2.0  --   --     Liver Function Tests: Recent Labs  Lab 12/07/19 1819 12/08/19 0540 12/09/19 0442  AST 19 15 12*  ALT 18 16 14   ALKPHOS 81 75 72  BILITOT 1.1 0.9 0.3  PROT 7.8 7.2 6.5  ALBUMIN 3.5 3.2* 2.8*    CBC: Recent Labs  Lab 12/07/19 1819 12/08/19 0540 12/09/19 0442  WBC 8.7 5.8 8.3  NEUTROABS 6.2 4.4 6.6  HGB 13.9  12.8 12.2  HCT 41.3 39.6 37.4  MCV 70.1* 73.1* 72.3*  PLT 434* 461* 486*    CBG: Recent Labs  Lab 12/08/19 1200 12/08/19 1652 12/08/19 2151 12/09/19 0759 12/09/19 1138  GLUCAP 323* 265* 332* 342* 340*    Recent Results (from the past 240 hour(s))  SARS Coronavirus 2 by RT PCR (hospital order, performed in [facility] hospital lab) Nasopharyngeal Nasopharyngeal Swab     Status: Abnormal   Collection Time: 12/07/19  6:17 PM   Specimen: Nasopharyngeal Swab  Result Value Ref Range Status   SARS Coronavirus 2 POSITIVE (A) NEGATIVE Final    Comment: RESULT CALLED TO, READ BACK BY AND VERIFIED WITH:  ILENE HODGES @ 2111 ON 12/07/19 C VARNER (NOTE) SARS-CoV-2 target nucleic acids are DETECTED  SARS-CoV-2 RNA is generally detectable in upper respiratory specimens  during the acute phase of infection.  Positive results are indicative  of the presence of the identified virus, but do not rule out bacterial infection or co-infection with other pathogens not detected by the test.  Clinical correlation with patient history and  other diagnostic information is necessary to determine patient infection status.  The expected result is negative.  Fact Sheet for Patients:   BoilerBrush.com.cy   Fact Sheet for Healthcare Providers:   https://pope.com/    This test is not yet approved or cleared by the [place] FDA and  has been authorized for detection and/or diagnosis of SARS-CoV-2 by FDA under an Emergency Use Authorization (EUA).  This EUA will remain in effect (meaning thi s test can be used) for the duration of  the COVID-19 declaration under Section 564(b)(1) of the Act, 21 U.S.C. section 360-bbb-3(b)(1), unless the authorization is terminated or revoked sooner.  Performed at [facility], [address], [place], [place] [address]   Culture, blood (Routine X 2) w Reflex to ID Panel     Status: None (Preliminary  result)   Collection Time: 12/08/19 12:08 AM   Specimen: BLOOD  Result Value Ref Range Status   Specimen Description   Final    BLOOD BLOOD RIGHT ARM Performed at [facility], [address] [place], [place] [address]    Special Requests   Final    BOTTLES DRAWN AEROBIC AND ANAEROBIC Blood Culture adequate volume Performed at [facility], [address], [place], [place] [address]    Culture   Final    NO GROWTH 1 DAY Performed at [facility], [address], [place], [place] [address]    Report Status PENDING  Incomplete  Culture, blood (Routine X 2) w Reflex to ID Panel     Status: None (Preliminary result)   Collection Time: 12/08/19  5:54 AM   Specimen: BLOOD  Result Value Ref Range Status   Specimen Description   Final    BLOOD RIGHT ANTECUBITAL Performed at [facility], [address] [place], [place] [address]    Special Requests   Final    BOTTLES DRAWN AEROBIC AND ANAEROBIC Blood Culture adequate volume Performed at [facility], [address] [place], [place] [address]    Culture   Final    NO GROWTH 1 DAY Performed at [facility], [address] [place], [place] [address]    Report Status PENDING  Incomplete      Time spent in discharge (includes decision making & examination of pt): 35 minutes  12/09/2019, 12:31 PM   Lonia Blood, MD [facility] Office  [phone]

## 2019-12-09 NOTE — Evaluation (Signed)
Occupational Therapy Evaluation Patient Details Name: Judith Garcia MRN: [id number] DOB: Jan 31, 1966 Today's Date: 12/09/2019    History of Present Illness 54 year old with a history of anxiety disorder and HLD who presented to the ED via EMS from a local urgent care with fatigue and shortness of breath.  Saturations were 90% on room air at the urgent care.  She was known to have tested positive for Covid 8/27 at a local [facility].  She has not been vaccinated.  She reports her symptoms first began 8/24. Found to be covid positive.  CXR noted multifocal peripheral and basilar predominant patchy opacities concerning for Covid pneumonia.   Clinical Impression   Judith Garcia is a 54 year old woman admitted to hospital with COVID. On evaluation she demonstrates functional upper body strength, ability to perform bed mobility, ambulation and ADLs. Patient ambulated 30 feet in room including performing toilet transfer and standing at sink to wash hands and patient's o2 sat dropped to 88% but recovered quickly. Patient ambulated 60 feet and o2 sat once again dropped to 88% but this time patient reporting more fatigue. Therapist educated patient on energy conservation strategies for return home and suggested use of shower chair for safety. Patient reports she will have 24/7 assistance at home and verbalized understanding of education. No further OT needs at this time.     Follow Up Recommendations  No OT follow up    Equipment Recommendations  None recommended by OT    Recommendations for Other Services       Precautions / Restrictions Precautions Precautions: None Restrictions Weight Bearing Restrictions: No      Mobility Bed Mobility Overal bed mobility: Independent                Transfers Overall transfer level: Modified independent Equipment used: None             General transfer comment: Decreased gait speed and reports of mild "tiredness" with ambulation.     Balance Overall balance assessment: No apparent balance deficits (not formally assessed)                                         ADL either performed or assessed with clinical judgement   ADL Overall ADL's : Modified independent                                       General ADL Comments: Increased time and rest breaks needed but otherwise needs no assistance. Has been ambulating to bathroom without nursing assistance.     Vision   Vision Assessment?: No apparent visual deficits     Perception     Praxis      Pertinent Vitals/Pain Pain Assessment: No/denies pain     Hand Dominance     Extremity/Trunk Assessment Upper Extremity Assessment Upper Extremity Assessment: Overall WFL for tasks assessed       Cervical / Trunk Assessment Cervical / Trunk Assessment: Normal   Communication Communication Communication: No difficulties   Cognition Arousal/Alertness: Awake/alert Behavior During Therapy: WFL for tasks assessed/performed Overall Cognitive Status: Within Functional Limits for tasks assessed  General Comments       Exercises     Shoulder Instructions      Home Living Family/patient expects to be discharged to:: Private residence Living Arrangements: Spouse/significant other;Children Available Help at Discharge: Family;Available 24 hours/day Type of Home: House       Home Layout: Multi-level     Bathroom Shower/Tub: Producer, television/film/video: Standard     Home Equipment: Shower seat          Prior Functioning/Environment Level of Independence: Independent                 OT Problem List: Cardiopulmonary status limiting activity      OT Treatment/Interventions:      OT Goals(Current goals can be found in the care plan section) Acute Rehab OT Goals Patient Stated Goal: To go home OT Goal Formulation: All assessment and education complete, DC  therapy  OT Frequency:     Barriers to D/C:            Co-evaluation              AM-PAC OT "6 Clicks" Daily Activity     Outcome Measure Help from another person eating meals?: None Help from another person taking care of personal grooming?: None Help from another person toileting, which includes using toliet, bedpan, or urinal?: None Help from another person bathing (including washing, rinsing, drying)?: None Help from another person to put on and taking off regular upper body clothing?: None Help from another person to put on and taking off regular lower body clothing?: None 6 Click Score: 24   End of Session Nurse Communication: Mobility status  Activity Tolerance: Patient tolerated treatment well Patient left: in bed;with call bell/phone within reach  OT Visit Diagnosis: Muscle weakness (generalized) (M62.81)                Time: 3491-7915 OT Time Calculation (min): 11 min Charges:  OT General Charges $OT Visit: 1 Visit OT Evaluation $OT Eval Low Complexity: 1 Low  Annielee Jemmott, OTR/L Acute Care Rehab Services  Office [phone] Pager: [phone]   Kelli Churn 12/09/2019, 3:01 PM

## 2019-12-09 NOTE — Discharge Instructions (Signed)
Date of Positive COVID Test: 11/26/19  Date Quarantine Ends: 12/17/19 (21 days)    COVID-19 COVID-19 is a respiratory infection that is caused by a virus called severe acute respiratory syndrome coronavirus 2 (SARS-CoV-2). The disease is also known as coronavirus disease or novel coronavirus. In some people, the virus may not cause any symptoms. In others, it may cause a serious infection. The infection can get worse quickly and can lead to complications, such as:  Pneumonia, or infection of the lungs.  Acute respiratory distress syndrome or ARDS. This is a condition in which fluid build-up in the lungs prevents the lungs from filling with air and passing oxygen into the blood.  Acute respiratory failure. This is a condition in which there is not enough oxygen passing from the lungs to the body or when carbon dioxide is not passing from the lungs out of the body.  Sepsis or septic shock. This is a serious bodily reaction to an infection.  Blood clotting problems.  Secondary infections due to bacteria or fungus.  Organ failure. This is when your body's organs stop working. The virus that causes COVID-19 is contagious. This means that it can spread from person to person through droplets from coughs and sneezes (respiratory secretions). What are the causes? This illness is caused by a virus. You may catch the virus by:  Breathing in droplets from an infected person. Droplets can be spread by a person breathing, speaking, singing, coughing, or sneezing.  Touching something, like a table or a doorknob, that was exposed to the virus (contaminated) and then touching your mouth, nose, or eyes. What increases the risk? Risk for infection You are more likely to be infected with this virus if you:  Are within 6 feet (2 meters) of a person with COVID-19.  Provide care for or live with a person who is infected with COVID-19.  Spend time in crowded indoor spaces or live in shared  housing. Risk for serious illness You are more likely to become seriously ill from the virus if you:  Are 54 years of age or older. The higher your age, the more you are at risk for serious illness.  Live in a nursing home or long-term care facility.  Have cancer.  Have a long-term (chronic) disease such as: ? Chronic lung disease, including chronic obstructive pulmonary disease or asthma. ? A long-term disease that lowers your body's ability to fight infection (immunocompromised). ? Heart disease, including heart failure, a condition in which the arteries that lead to the heart become narrow or blocked (coronary artery disease), a disease which makes the heart muscle thick, weak, or stiff (cardiomyopathy). ? Diabetes. ? Chronic kidney disease. ? Sickle cell disease, a condition in which red blood cells have an abnormal "sickle" shape. ? Liver disease.  Are obese. What are the signs or symptoms? Symptoms of this condition can range from mild to severe. Symptoms may appear any time from 2 to 14 days after being exposed to the virus. They include:  A fever or chills.  A cough.  Difficulty breathing.  Headaches, body aches, or muscle aches.  Runny or stuffy (congested) nose.  A sore throat.  New loss of taste or smell. Some people may also have stomach problems, such as nausea, vomiting, or diarrhea. Other people may not have any symptoms of COVID-19. How is this diagnosed? This condition may be diagnosed based on:  Your signs and symptoms, especially if: ? You live in an area with a COVID-19 outbreak. ?  You recently traveled to or from an area where the virus is common. ? You provide care for or live with a person who was diagnosed with COVID-19. ? You were exposed to a person who was diagnosed with COVID-19.  A physical exam.  Lab tests, which may include: ? Taking a sample of fluid from the back of your nose and throat (nasopharyngeal fluid), your nose, or your  throat using a swab. ? A sample of mucus from your lungs (sputum). ? Blood tests.  Imaging tests, which may include, X-rays, CT scan, or ultrasound. How is this treated? At present, there is no medicine to treat COVID-19. Medicines that treat other diseases are being used on a trial basis to see if they are effective against COVID-19. Your health care provider will talk with you about ways to treat your symptoms. For most people, the infection is mild and can be managed at home with rest, fluids, and over-the-counter medicines. Treatment for a serious infection usually takes places in a hospital intensive care unit (ICU). It may include one or more of the following treatments. These treatments are given until your symptoms improve.  Receiving fluids and medicines through an IV.  Supplemental oxygen. Extra oxygen is given through a tube in the nose, a face mask, or a hood.  Positioning you to lie on your stomach (prone position). This makes it easier for oxygen to get into the lungs.  Continuous positive airway pressure (CPAP) or bi-level positive airway pressure (BPAP) machine. This treatment uses mild air pressure to keep the airways open. A tube that is connected to a motor delivers oxygen to the body.  Ventilator. This treatment moves air into and out of the lungs by using a tube that is placed in your windpipe.  Tracheostomy. This is a procedure to create a hole in the neck so that a breathing tube can be inserted.  Extracorporeal membrane oxygenation (ECMO). This procedure gives the lungs a chance to recover by taking over the functions of the heart and lungs. It supplies oxygen to the body and removes carbon dioxide. Follow these instructions at home: Lifestyle  If you are sick, stay home except to get medical care. Your health care provider will tell you how long to stay home. Call your health care provider before you go for medical care.  Rest at home as told by your health care  provider.  Do not use any products that contain nicotine or tobacco, such as cigarettes, e-cigarettes, and chewing tobacco. If you need help quitting, ask your health care provider.  Return to your normal activities as told by your health care provider. Ask your health care provider what activities are safe for you. General instructions  Take over-the-counter and prescription medicines only as told by your health care provider.  Drink enough fluid to keep your urine pale yellow.  Keep all follow-up visits as told by your health care provider. This is important. How is this prevented?  There is no vaccine to help prevent COVID-19 infection. However, there are steps you can take to protect yourself and others from this virus. To protect yourself:   Do not travel to areas where COVID-19 is a risk. The areas where COVID-19 is reported change often. To identify high-risk areas and travel restrictions, check the CDC travel website: FatFares.com.br  If you live in, or must travel to, an area where COVID-19 is a risk, take precautions to avoid infection. ? Stay away from people who are sick. ?  Wash your hands often with soap and water for 20 seconds. If soap and water are not available, use an alcohol-based hand sanitizer. ? Avoid touching your mouth, face, eyes, or nose. ? Avoid going out in public, follow guidance from your state and local health authorities. ? If you must go out in public, wear a cloth face covering or face mask. Make sure your mask covers your nose and mouth. ? Avoid crowded indoor spaces. Stay at least 6 feet (2 meters) away from others. ? Disinfect objects and surfaces that are frequently touched every day. This may include:  Counters and tables.  Doorknobs and light switches.  Sinks and faucets.  Electronics, such as phones, remote controls, keyboards, computers, and tablets. To protect others: If you have symptoms of COVID-19, take steps to prevent  the virus from spreading to others.  If you think you have a COVID-19 infection, contact your health care provider right away. Tell your health care team that you think you may have a COVID-19 infection.  Stay home. Leave your house only to seek medical care. Do not use public transport.  Do not travel while you are sick.  Wash your hands often with soap and water for 20 seconds. If soap and water are not available, use alcohol-based hand sanitizer.  Stay away from other members of your household. Let healthy household members care for children and pets, if possible. If you have to care for children or pets, wash your hands often and wear a mask. If possible, stay in your own room, separate from others. Use a different bathroom.  Make sure that all people in your household wash their hands well and often.  Cough or sneeze into a tissue or your sleeve or elbow. Do not cough or sneeze into your hand or into the air.  Wear a cloth face covering or face mask. Make sure your mask covers your nose and mouth. Where to find more information  Centers for Disease Control and Prevention: StickerEmporium.tn  World Health Organization: https://thompson-craig.com/ Contact a health care provider if:  You live in or have traveled to an area where COVID-19 is a risk and you have symptoms of the infection.  You have had contact with someone who has COVID-19 and you have symptoms of the infection. Get help right away if:  You have trouble breathing.  You have pain or pressure in your chest.  You have confusion.  You have bluish lips and fingernails.  You have difficulty waking from sleep.  You have symptoms that get worse. These symptoms may represent a serious problem that is an emergency. Do not wait to see if the symptoms will go away. Get medical help right away. Call your local emergency services (911 in the [place] Do not drive yourself to the hospital.  Let the emergency medical personnel know if you think you have COVID-19. Summary  COVID-19 is a respiratory infection that is caused by a virus. It is also known as coronavirus disease or novel coronavirus. It can cause serious infections, such as pneumonia, acute respiratory distress syndrome, acute respiratory failure, or sepsis.  The virus that causes COVID-19 is contagious. This means that it can spread from person to person through droplets from breathing, speaking, singing, coughing, or sneezing.  You are more likely to develop a serious illness if you are 27 years of age or older, have a weak immune system, live in a nursing home, or have chronic disease.  There is no medicine to treat  COVID-19. Your health care provider will talk with you about ways to treat your symptoms.  Take steps to protect yourself and others from infection. Wash your hands often and disinfect objects and surfaces that are frequently touched every day. Stay away from people who are sick and wear a mask if you are sick. This information is not intended to replace advice given to you by your health care provider. Make sure you discuss any questions you have with your health care provider. Document Revised: 01/15/2019 Document Reviewed: 04/23/2018 Elsevier Patient Education  2020 Elsevier Inc.   COVID-19: How to Protect Yourself and Others Know how it spreads  There is currently no vaccine to prevent coronavirus disease 2019 (COVID-19).  The best way to prevent illness is to avoid being exposed to this virus.  The virus is thought to spread mainly from person-to-person. ? Between people who are in close contact with one another (within about 6 feet). ? Through respiratory droplets produced when an infected person coughs, sneezes or talks. ? These droplets can land in the mouths or noses of people who are nearby or possibly be inhaled into the lungs. ? COVID-19 may be spread by people who are not showing  symptoms. Everyone should Clean your hands often  Wash your hands often with soap and water for at least 20 seconds especially after you have been in a public place, or after blowing your nose, coughing, or sneezing.  If soap and water are not readily available, use a hand sanitizer that contains at least 60% alcohol. Cover all surfaces of your hands and rub them together until they feel dry.  Avoid touching your eyes, nose, and mouth with unwashed hands. Avoid close contact  Limit contact with others as much as possible.  Avoid close contact with people who are sick.  Put distance between yourself and other people. ? Remember that some people without symptoms may be able to spread virus. ? This is especially important for people who are at higher risk of getting very RetroStamps.it Cover your mouth and nose with a mask when around others  You could spread COVID-19 to others even if you do not feel sick.  Everyone should wear a mask in public settings and when around people not living in their household, especially when social distancing is difficult to maintain. ? Masks should not be placed on young children under age 13, anyone who has trouble breathing, or is unconscious, incapacitated or otherwise unable to remove the mask without assistance.  The mask is meant to protect other people in case you are infected.  Do NOT use a facemask meant for a Research scientist (physical sciences).  Continue to keep about 6 feet between yourself and others. The mask is not a substitute for social distancing. Cover coughs and sneezes  Always cover your mouth and nose with a tissue when you cough or sneeze or use the inside of your elbow.  Throw used tissues in the trash.  Immediately wash your hands with soap and water for at least 20 seconds. If soap and water are not readily available, clean your hands with a hand sanitizer that contains  at least 60% alcohol. Clean and disinfect  Clean AND disinfect frequently touched surfaces daily. This includes tables, doorknobs, light switches, countertops, handles, desks, phones, keyboards, toilets, faucets, and sinks. ktimeonline.com  If surfaces are dirty, clean them: Use detergent or soap and water prior to disinfection.  Then, use a household disinfectant. You can see a list of EPA-registered  household disinfectants here. SouthAmericaFlowers.co.uk 12/02/2018 This information is not intended to replace advice given to you by your health care provider. Make sure you discuss any questions you have with your health care provider. Document Revised: 12/10/2018 Document Reviewed: 10/08/2018 Elsevier Patient Education  2020 [employer].

## 2019-12-09 NOTE — Progress Notes (Signed)
Patient Saturations on Room Air at Rest = 90%  Patient Saturations on [employer] while Ambulating = 88%  1L of Oxygen raised Patient Saturation to 96%   RN educated patient on proper breathing techniques, using pursed lip breathing.  RN also reinforced proper use of incentive spirometer.

## 2019-12-09 NOTE — Progress Notes (Signed)
PT Cancellation Note  Patient Details Name: Judith Garcia MRN: [id number] DOB: [birth year]   Cancelled Treatment:    Reason Eval/Treat Not Completed: PT screened, no needs identified, will sign off RN reports pt bathed and dressed earlier and to d/c home today.  Pt has been up ambulating around room without difficulty.  RN sees no current PT needs at this time.  PT to sign off.   Mansi Tokar,KATHrine E 12/09/2019, 2:36 PM Thomasene Mohair PT, DPT Acute Rehabilitation Services Pager: [phone] Office: [phone]

## 2019-12-09 NOTE — Plan of Care (Signed)

## 2019-12-10 ENCOUNTER — Telehealth (INDEPENDENT_AMBULATORY_CARE_PROVIDER_SITE_OTHER): Payer: Federal, State, Local not specified - PPO | Admitting: Family Medicine

## 2019-12-10 ENCOUNTER — Other Ambulatory Visit: Payer: Self-pay

## 2019-12-10 ENCOUNTER — Encounter: Payer: Self-pay | Admitting: Family Medicine

## 2019-12-10 VITALS — BP 164/101 | HR 115 | Ht 62.25 in | Wt 147.0 lb

## 2019-12-10 DIAGNOSIS — E119 Type 2 diabetes mellitus without complications: Secondary | ICD-10-CM | POA: Diagnosis not present

## 2019-12-10 DIAGNOSIS — U071 COVID-19: Secondary | ICD-10-CM | POA: Diagnosis not present

## 2019-12-10 DIAGNOSIS — J1282 Pneumonia due to coronavirus disease 2019: Secondary | ICD-10-CM

## 2019-12-10 DIAGNOSIS — R030 Elevated blood-pressure reading, without diagnosis of hypertension: Secondary | ICD-10-CM | POA: Diagnosis not present

## 2019-12-10 MED ORDER — BLOOD GLUCOSE MONITOR KIT: PACK | 0 refills | Status: DC

## 2019-12-10 MED ORDER — ALBUTEROL SULFATE HFA 108 (90 BASE) MCG/ACT IN AERS: 2.0000 | INHALATION_SPRAY | Freq: Four times a day (QID) | RESPIRATORY_TRACT | 2 refills | Status: DC | PRN

## 2019-12-10 NOTE — Progress Notes (Signed)
VIRTUAL VISIT Due to national recommendations of social distancing due to COVID 19, a virtual visit is felt to be most appropriate for this patient at this time.   I connected with the patient on 12/10/19 at  8:40 AM EDT by virtual telehealth platform and verified that I am speaking with the correct person using two identifiers.   I discussed the limitations, risks, security and privacy concerns of performing an evaluation and management service by  virtual telehealth platform and the availability of in person appointments. I also discussed with the patient that there may be a patient responsible charge related to this service. The patient expressed understanding and agreed to proceed.  Patient location: Home Provider Location: [place] Participants: Kerby Nora and [name]   Chief Complaint  Patient presents with  . Hospitalization Follow-up    PNA due to Covid    History of Present Illness: 54 year old female with history of iron deficiency, diabetes and elevated blood pressure without Dx of hypertension presents following hospitalization for COVID19 pneumonia.   Admitted 12/07/2019, Discharged 12/09/2019  SOB and O2 sats 90%.  CXR showed: multifocal peripheral and basilar predominant patchy opacities concerning for Covid pneumonia.   Date of Positive COVID Test: 11/26/19  Date Quarantine Ends: 12/17/19 (21 days)  COVID-19 specific Treatment: Remdesivir 9/7 > 9/9 Steroid 9/7 > 9/16  Last OV with me 05/2018. For DM .Marland Kitchen A1C was well controlled at 6.4. Recent A1C in hospital showed poor control A1C 9.4.Marland Kitchen likely chronically poor control but also worse with steroid. She was discharged on metformin ER.  Blood pressure has been elevated. Possibly due to the steroid.  She feels anxious.Marland Kitchen and feels BP up from this. BP Readings from Last 3 Encounters:  12/10/19 (!) 164/101  12/09/19 136/86  12/07/19 (!) 153/99    She reports today that her breathing is  improved. She continues to cough. No ever.  Appetite is decrease. No chest pain. She cough med rx if she needs.   COVID 19 screen No recent travel or known exposure to COVID19 The patient denies respiratory symptoms of COVID 19 at this time.  The importance of social distancing was discussed today.   Review of Systems  Constitutional: Negative for chills and fever.  HENT: Negative for congestion and ear pain.   Eyes: Negative for pain and redness.  Respiratory: Positive for cough and shortness of breath. Negative for sputum production and wheezing.   Cardiovascular: Negative for chest pain, palpitations and leg swelling.  Gastrointestinal: Negative for abdominal pain, blood in stool, constipation, diarrhea, nausea and vomiting.  Genitourinary: Negative for dysuria.  Musculoskeletal: Negative for falls and myalgias.  Skin: Negative for rash.  Neurological: Negative for dizziness.  Psychiatric/Behavioral: Negative for depression. The patient is not nervous/anxious.       Past Medical History:  Diagnosis Date  . Anemia   . Anxiety   . Gestational diabetes   . Hyperlipidemia     reports that she has never smoked. She has never used smokeless tobacco. She reports that she does not drink alcohol and does not use drugs.   Current Outpatient Medications:  .  acetaminophen (TYLENOL) 325 MG tablet, Take 2 tablets (650 mg total) by mouth every 6 (six) hours as needed for mild pain or headache (fever >/= 101)., Disp: , Rfl:  .  guaiFENesin-codeine (CHERATUSSIN AC) 100-10 MG/5ML syrup, Take 5 mLs by mouth 2 (two) times daily as needed for cough (sedation precautions)., Disp: 120 mL, Rfl: 0 .  metFORMIN (GLUCOPHAGE XR) 750 MG 24 hr tablet, Take 1 tablet (750 mg total) by mouth daily with breakfast., Disp: 30 tablet, Rfl: 11 .  predniSONE (DELTASONE) 10 MG tablet, Take 4 tablets (40 mg total) by mouth daily with breakfast for 2 days., Disp: 8 tablet, Rfl: 0 .  [START ON 12/12/2019] predniSONE  (DELTASONE) 10 MG tablet, Take 2 tablets (20 mg total) by mouth daily with breakfast for 2 days., Disp: 4 tablet, Rfl: 0 .  [START ON 12/14/2019] predniSONE (DELTASONE) 10 MG tablet, Take 1 tablet (10 mg total) by mouth daily with breakfast for 3 days., Disp: 3 tablet, Rfl: 0   Observations/Objective: Blood pressure (!) 164/101, pulse (!) 115, height 5' 2.25" (1.581 m), weight 147 lb (66.7 kg), last menstrual period 06/14/2017.  Physical Exam  Physical Exam Constitutional:      General: The patient is not in acute distress. Pulmonary:     Effort: Pulmonary effort is normal. No respiratory distress.  Neurological:     Mental Status: The patient is alert and oriented to person, place, and time.  Psychiatric:        Mood and Affect: Mood normal.        Behavior: Behavior normal.   Assessment and Plan Pneumonia due to COVID-19 virus Improving. Complete prednisone taper.  t requests albuterol inhaler to be available if she needs it.    Type 2 diabetes mellitus without complication, without long-term current use of insulin (HCC) Poor cotntrol.. encouraged pt to start metformin, as feeling better low carb diet, exercise and weight management. Close follow up in 2-3 weeks.  Elevated blood pressure reading in office without diagnosis of hypertension ? Due to prednisone and anxiety?   Follow closely. Pt will call if above goal in next few days for possible medication to treat.     I discussed the assessment and treatment plan with the patient. The patient was provided an opportunity to ask questions and all were answered. The patient agreed with the plan and demonstrated an understanding of the instructions.   The patient was advised to call back or seek an in-person evaluation if the symptoms worsen or if the condition fails to improve as anticipated.     Kerby Nora, MD

## 2019-12-10 NOTE — Assessment & Plan Note (Signed)
Improving. Complete prednisone taper.  t requests albuterol inhaler to be available if she needs it.

## 2019-12-10 NOTE — Assessment & Plan Note (Signed)
Poor cotntrol.. encouraged pt to start metformin, as feeling better low carb diet, exercise and weight management. Close follow up in 2-3 weeks.

## 2019-12-10 NOTE — Assessment & Plan Note (Signed)
?   Due to prednisone and anxiety?   Follow closely. Pt will call if above goal in next few days for possible medication to treat.

## 2019-12-10 NOTE — Patient Instructions (Addendum)
Follow oxygen saturations at home... If < 88%.  Start metformin.  Work on low [employer].   Check blood  sugar fasting in morning.Judith Garcia goal < 120.  Also occasionally check 2 hours after a meal.. goal < 180.  Follow BP at home.Judith Garcia goal < 140/90... call if above goal over the weekend.

## 2019-12-15 LAB — CULTURE, BLOOD (ROUTINE X 2)
Culture: NO GROWTH
Culture: NO GROWTH
Special Requests: ADEQUATE
Special Requests: ADEQUATE

## 2019-12-21 ENCOUNTER — Other Ambulatory Visit: Payer: Self-pay | Admitting: Internal Medicine

## 2019-12-21 DIAGNOSIS — U071 COVID-19: Secondary | ICD-10-CM

## 2019-12-21 DIAGNOSIS — J1282 Pneumonia due to coronavirus disease 2019: Secondary | ICD-10-CM

## 2019-12-22 DIAGNOSIS — Z20822 Contact with and (suspected) exposure to covid-19: Secondary | ICD-10-CM | POA: Diagnosis not present

## 2020-01-07 ENCOUNTER — Ambulatory Visit: Payer: Federal, State, Local not specified - PPO | Admitting: Family Medicine

## 2020-01-07 ENCOUNTER — Other Ambulatory Visit: Payer: Self-pay

## 2020-01-07 VITALS — BP 148/88 | HR 106 | Temp 98.0°F | Ht 62.25 in | Wt 142.0 lb

## 2020-01-07 DIAGNOSIS — Z8616 Personal history of COVID-19: Secondary | ICD-10-CM

## 2020-01-07 DIAGNOSIS — Z23 Encounter for immunization: Secondary | ICD-10-CM | POA: Diagnosis not present

## 2020-01-07 DIAGNOSIS — E1169 Type 2 diabetes mellitus with other specified complication: Secondary | ICD-10-CM | POA: Diagnosis not present

## 2020-01-07 DIAGNOSIS — R053 Chronic cough: Secondary | ICD-10-CM | POA: Diagnosis not present

## 2020-01-07 DIAGNOSIS — U071 COVID-19: Secondary | ICD-10-CM

## 2020-01-07 DIAGNOSIS — E119 Type 2 diabetes mellitus without complications: Secondary | ICD-10-CM

## 2020-01-07 DIAGNOSIS — E785 Hyperlipidemia, unspecified: Secondary | ICD-10-CM

## 2020-01-07 DIAGNOSIS — E1159 Type 2 diabetes mellitus with other circulatory complications: Secondary | ICD-10-CM | POA: Diagnosis not present

## 2020-01-07 DIAGNOSIS — Z8701 Personal history of pneumonia (recurrent): Secondary | ICD-10-CM

## 2020-01-07 DIAGNOSIS — I152 Hypertension secondary to endocrine disorders: Secondary | ICD-10-CM

## 2020-01-07 LAB — HM DIABETES FOOT EXAM

## 2020-01-07 MED ORDER — METFORMIN HCL 500 MG PO TABS: 500.0000 mg | ORAL_TABLET | Freq: Two times a day (BID) | ORAL | 11 refills | Status: DC

## 2020-01-07 NOTE — Assessment & Plan Note (Signed)
Resolved. Clear lung exam.  Residual  Symptoms improving with time.

## 2020-01-07 NOTE — Assessment & Plan Note (Signed)
Statin indicated but pt refuses.

## 2020-01-07 NOTE — Progress Notes (Signed)
Chief Complaint  Patient presents with  . Hospitalization Follow-up    has log of bp and blood sugar     History of Present Illness: HPI    54 year old female presents for  In person follow up of hospitalization for COVID infection.  Date of onset 11/26/2019.. presented to Urgent care on 9/7 with SOB, chest tightness  Found to be tachypneic, tachycardiac and hypoxic, transferred to  ER   Date of Admission: 12/07/2019 Date of Discharge: 12/09/2019  COVID-19 specific Treatment: Remdesivir 9/7 > 9/9 Steroid 9/7 > 9/16  Dx with COVID pneumonia, hypokalemia and acute respi=ratory failure. Completed 3 days of remdesivir -given rapid improvement continuation of outpatient remdesivir not felt to be indicated -complete 10 days of steroid treatment with weaning of dose beginning at time of discharge -patient advised to continue to ambulate at home and to avoid prolonged recumbent position except for when sleeping at night- incentive spirometer use encouraged very frequently throughout the day -oxygen saturations remained at 88% or greater even with exertion while on room air on date of discharge therefore home oxygen not required -patient advised to return to the ED should she develop severe shortness of breath or other worrisome signs of progressive infection   Last OV with me 05/2018. For DM .Marland Kitchen A1C was well controlled at 6.4 In hospital: A1C was found to be 9.4.Marland Kitchen started on long acting metformin.  She reports she still has decreased energy, occ mild coughing ,clear phlegm.  No fever. Mild SOB with exertion.  No CP, palpitations.   She has had 2 elevated BPs now... on no  Medication. BP Readings from Last 3 Encounters:  01/07/20 (!) 148/88  12/10/19 (!) 164/101  12/09/19 136/86  At home her BPs have been: 132-160/77-89  She is now off prednisone.  Her CBGs:  FBS 160-180, occ 120 She has not been able to take metformin ER... causes diarrhea, but she would like to take intermediate  release. Has only taken the metformin a couple times.  She is walking , and low carbs in diet.  Lab Results  Component Value Date   HGBA1C 9.4 (H) 12/08/2019     This visit occurred during the SARS-CoV-2 public health emergency.  Safety protocols were in place, including screening questions prior to the visit, additional usage of staff PPE, and extensive cleaning of exam room while observing appropriate contact time as indicated for disinfecting solutions.   COVID 19 screen:  No recent travel or known exposure to COVID19 The patient denies respiratory symptoms of COVID 19 at this time. The importance of social distancing was discussed today.     Review of Systems  Constitutional: Negative for chills and fever.  HENT: Negative for congestion and ear pain.   Eyes: Negative for pain and redness.  Respiratory: Negative for cough and shortness of breath.   Cardiovascular: Negative for chest pain, palpitations and leg swelling.  Gastrointestinal: Negative for abdominal pain, blood in stool, constipation, diarrhea, nausea and vomiting.  Genitourinary: Negative for dysuria.  Musculoskeletal: Negative for falls and myalgias.  Skin: Negative for rash.  Neurological: Negative for dizziness.  Psychiatric/Behavioral: Negative for depression. The patient is not nervous/anxious.       Past Medical History:  Diagnosis Date  . Anemia   . Anxiety   . Gestational diabetes   . Hyperlipidemia     reports that she has never smoked. She has never used smokeless tobacco. She reports that she does not drink alcohol and does not use  drugs.   Current Outpatient Medications:  .  acetaminophen (TYLENOL) 325 MG tablet, Take 2 tablets (650 mg total) by mouth every 6 (six) hours as needed for mild pain or headache (fever >/= 101)., Disp: , Rfl:  .  albuterol (VENTOLIN HFA) 108 (90 Base) MCG/ACT inhaler, Inhale 2 puffs into the lungs every 6 (six) hours as needed for wheezing or shortness of breath.,  Disp: 8 g, Rfl: 2 .  Magnesium 100 MG CAPS, Take by mouth., Disp: , Rfl:  .  metFORMIN (GLUCOPHAGE XR) 750 MG 24 hr tablet, Take 1 tablet (750 mg total) by mouth daily with breakfast., Disp: 30 tablet, Rfl: 11 .  Multiple Vitamins-Minerals (MULTI COMPLETE) CAPS, Take by mouth., Disp: , Rfl:  .  OVER THE COUNTER MEDICATION, Immune 24 hr, Disp: , Rfl:  .  Probiotic Product (PROBIOMAX DAILY DF PO), Take by mouth., Disp: , Rfl:    Observations/Objective: Blood pressure (!) 148/88, pulse (!) 106, temperature 98 F (36.7 C), temperature source Temporal, height 5' 2.25" (1.581 m), weight 142 lb (64.4 kg), last menstrual period 06/14/2017, SpO2 100 %.  Physical Exam Constitutional:      General: She is not in acute distress.    Appearance: Normal appearance. She is well-developed. She is not ill-appearing or toxic-appearing.  HENT:     Head: Normocephalic.     Right Ear: Hearing, tympanic membrane, ear canal and external ear normal. Tympanic membrane is not erythematous, retracted or bulging.     Left Ear: Hearing, tympanic membrane, ear canal and external ear normal. Tympanic membrane is not erythematous, retracted or bulging.     Nose: No mucosal edema or rhinorrhea.     Right Sinus: No maxillary sinus tenderness or frontal sinus tenderness.     Left Sinus: No maxillary sinus tenderness or frontal sinus tenderness.     Mouth/Throat:     Pharynx: Uvula midline.  Eyes:     General: Lids are normal. Lids are everted, no foreign bodies appreciated.     Conjunctiva/sclera: Conjunctivae normal.     Pupils: Pupils are equal, round, and reactive to light.  Neck:     Thyroid: No thyroid mass or thyromegaly.     Vascular: No carotid bruit.     Trachea: Trachea normal.  Cardiovascular:     Rate and Rhythm: Normal rate and regular rhythm.     Pulses: Normal pulses.     Heart sounds: Normal heart sounds, S1 normal and S2 normal. No murmur heard.  No friction rub. No gallop.   Pulmonary:      Effort: Pulmonary effort is normal. No tachypnea or respiratory distress.     Breath sounds: Normal breath sounds. No decreased breath sounds, wheezing, rhonchi or rales.  Abdominal:     General: Bowel sounds are normal.     Palpations: Abdomen is soft.     Tenderness: There is no abdominal tenderness.  Musculoskeletal:     Cervical back: Normal range of motion and neck supple.  Skin:    General: Skin is warm and dry.     Findings: No rash.  Neurological:     Mental Status: She is alert.  Psychiatric:        Mood and Affect: Mood is not anxious or depressed.        Speech: Speech normal.        Behavior: Behavior normal. Behavior is cooperative.        Thought Content: Thought content normal.  Judgment: Judgment normal.     Diabetic foot exam: Normal inspection No skin breakdown No calluses  Normal DP pulses Normal sensation to light touch and monofilament Nails normal  Assessment and Plan   Type 2 diabetes mellitus without complication, without long-term current use of insulin (HCC) Inadequate control but improving.  SE to metformin XR... will try shorter acting BID given husband tolerates this better GI wise. She will let me know if she cannot take it.  Re-eval A1C in 2 months .  Diet info given.. she is eating a lot of pineapple and watermelon.. change to apple with peel.   Hyperlipidemia associated with type 2 diabetes mellitus (HCC) Statin indicated but pt refuses.  Cough, persistent Improving with time.  Hypertension associated with diabetes (HCC)  New diagnosis.. ACEI/ARM indicated .. pt refuses med... want to Work on lifestyle and distance from stress of COVID.  pt told to call if > 160/100 ASAP.  Pneumonia due to COVID-19 virus Resolved. Clear lung exam.  Residual  Symptoms improving with time.     Kerby Nora, MD

## 2020-01-07 NOTE — Assessment & Plan Note (Signed)
New diagnosis.. ACEI/ARM indicated .. pt refuses med... want to Work on lifestyle and distance from stress of COVID.  pt told to call if > 160/100 ASAP.

## 2020-01-07 NOTE — Patient Instructions (Addendum)
Continue to follow BP  and blood sugar at home.  Try short acting metformin twice daily.  Work   on low carb diet and increase exercise.

## 2020-01-07 NOTE — Assessment & Plan Note (Addendum)
Inadequate control but improving.  SE to metformin XR... will try shorter acting BID given husband tolerates this better GI wise. She will let me know if she cannot take it.  Re-eval A1C in 2 months .  Diet info given.. she is eating a lot of pineapple and watermelon.. change to apple with peel.

## 2020-01-07 NOTE — Assessment & Plan Note (Signed)
Improving with time 

## 2020-01-11 ENCOUNTER — Telehealth: Payer: Self-pay

## 2020-01-11 DIAGNOSIS — E119 Type 2 diabetes mellitus without complications: Secondary | ICD-10-CM

## 2020-01-11 NOTE — Telephone Encounter (Signed)
Pt has picked up metformin but the paperwork with the medication says not to skip doses or discontinue medication. Pt said she was in hospital with covid and took a lot of steroids and prior to that pt said her A1c was pretty normal. Pt wants to know if she can change her lifestyle, and watch diet and exercise and then wait to start metformin in Dec when has labs redone. Pt request cb after Dr Ermalene Searing reviews this note.

## 2020-01-12 NOTE — Addendum Note (Signed)
Addended byKerby Nora E on: 01/12/2020 01:02 PM   Modules accepted: Orders

## 2020-01-12 NOTE — Addendum Note (Signed)
Addended by: Donnamarie Poag on: 01/12/2020 11:34 AM   Modules accepted: Orders

## 2020-01-18 LAB — HM DIABETES EYE EXAM

## 2020-01-20 NOTE — Addendum Note (Signed)
Addended by: Kerby Nora E on: 01/20/2020 09:49 AM   Modules accepted: Orders

## 2020-02-01 ENCOUNTER — Encounter: Payer: Self-pay | Admitting: Family Medicine

## 2020-02-01 NOTE — Progress Notes (Signed)
Lemonte Al T. Aashi Derrington, MD, CAQ Sports Medicine  Primary Care and Sports Medicine [facility] [address] [place], [address]  Phone: [phone]  FAX: [phone]  Judith Garcia - 54 y.o. female  MRN [id number]  Date of Birth: 01-26-1966  Date: 02/02/2020  PCP: Excell Seltzer, MD  Referral: Excell Seltzer, MD  Chief Complaint  Patient presents with  . Leg Pain    Left    This visit occurred during the SARS-CoV-2 public health emergency.  Safety protocols were in place, including screening questions prior to the visit, additional usage of staff PPE, and extensive cleaning of exam room while observing appropriate contact time as indicated for disinfecting solutions.   Subjective:   Judith Garcia is a 54 y.o. very pleasant female patient with Body mass index is 25.85 kg/m. who presents with the following:  She is a very nice lady and she presents today with some left-sided leg pain versus knee pain and she is here today in consultation courtesy of my partner Dr. Ermalene Searing.  She has been having left knee pain for about the last 4 months.  In other additional treatments have not really helped all that much.  It had seemed to be doing better, but a few weeks ago she did have some more additional acute pain.  She is having pain both medially and laterally, but this is worse medially.  She also has some pain in the lower extremity as well caudally.  This is predominantly in the anterior shin and in much to a lesser degree posteriorly.  She has never had any bruising, and she has never had any major trauma to the knee, fractures, dislocations, or surgery.  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  She does use the elliptical and walks some every few days traditionally, but right now this is been quite hard.  She has avoided oral medication at this  point, but she has used some topical remedies  Review of Systems is noted in the HPI, as appropriate   Objective:   BP 130/70   Pulse 93   Temp 97.9 F (36.6 C) (Temporal)   Ht 5' 2.25" (1.581 m)   Wt 142 lb 8 oz (64.6 kg)   LMP 06/14/2017   SpO2 99%   BMI 25.85 kg/m   Left knee: Full extension and flexion to 125.  Stable to varus and valgus stress.  ACL and PCL are intact.  No significant effusion.  Modest tenderness in the anterior shin with no pain along the tibia medially or laterally.  Bounce home testing as well as deep flexion causes pain.  McMurray's causes pain without mechanical symptoms.  She does have some tenderness in the posterior medial joint line.  She is able to ambulate with minimal pain only.  Radiology: DG Knee 4 Views W/Patella Left  Result Date: 02/03/2020 CLINICAL DATA:  Medial knee pain. EXAM: LEFT KNEE - COMPLETE 4+ VIEW COMPARISON:  None. FINDINGS: No acute fracture or dislocation. No joint effusion. Mild medial compartment joint space narrowing. Bone mineralization is normal. Soft tissues are unremarkable. Single weight-bearing frontal view of the right knee is unremarkable. IMPRESSION: 1. Mild medial compartment degenerative changes. Electronically Signed   By: Obie Dredge M.D.   On: 02/03/2020 15:51     Assessment and Plan:     ICD-10-CM  1. Acute leg pain, left  M79.605 DG Knee 4 Views W/Patella Left   Historically, she was having some knee pain and then more recently she did develop some significant changes to her knee with some more pain.  Significant pain posterior medial joint line.  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  We talked about treatment options, and for now conservative measures will likely resolve this on its own.  She and I both agree that doing nothing for the next 2 to 3 weeks besides range of motion and exercising limited by pain is a  very appropriate step.    Orders Placed This Encounter  Procedures  . DG Knee 4 Views W/Patella Left    Follow-up: Return in about 5 weeks (around 03/08/2020).  Signed,  Elpidio Galea. Meerab Maselli, MD   Outpatient Encounter Medications as of 02/02/2020  Medication Sig  . acetaminophen (TYLENOL) 325 MG tablet Take 2 tablets (650 mg total) by mouth every 6 (six) hours as needed for mild pain or headache (fever >/= 101).  . Multiple Vitamins-Minerals (MULTI COMPLETE) CAPS Take by mouth.  . Probiotic Product (PROBIOMAX DAILY DF PO) Take by mouth.  . metFORMIN (GLUCOPHAGE) 500 MG tablet Take 1 tablet (500 mg total) by mouth 2 (two) times daily with a meal. (Patient not taking: Reported on 02/02/2020)  . [DISCONTINUED] albuterol (VENTOLIN HFA) 108 (90 Base) MCG/ACT inhaler Inhale 2 puffs into the lungs every 6 (six) hours as needed for wheezing or shortness of breath.  . [DISCONTINUED] Magnesium 100 MG CAPS Take by mouth.  . [DISCONTINUED] OVER THE COUNTER MEDICATION Immune 24 hr   No facility-administered encounter medications on file as of 02/02/2020.

## 2020-02-02 ENCOUNTER — Ambulatory Visit (INDEPENDENT_AMBULATORY_CARE_PROVIDER_SITE_OTHER)
Admission: RE | Admit: 2020-02-02 | Discharge: 2020-02-02 | Disposition: A | Discharge status: Home / Self Care | Payer: Federal, State, Local not specified - PPO | Source: Ambulatory Visit | Attending: Family Medicine | Admitting: Family Medicine

## 2020-02-02 ENCOUNTER — Other Ambulatory Visit: Payer: Self-pay

## 2020-02-02 ENCOUNTER — Encounter: Payer: Self-pay | Admitting: Family Medicine

## 2020-02-02 ENCOUNTER — Ambulatory Visit: Payer: Federal, State, Local not specified - PPO | Admitting: Family Medicine

## 2020-02-02 VITALS — BP 130/70 | HR 93 | Temp 97.9°F | Ht 62.25 in | Wt 142.5 lb

## 2020-02-02 DIAGNOSIS — M2392 Unspecified internal derangement of left knee: Secondary | ICD-10-CM

## 2020-02-02 DIAGNOSIS — M79605 Pain in left leg: Secondary | ICD-10-CM

## 2020-02-02 DIAGNOSIS — M1712 Unilateral primary osteoarthritis, left knee: Secondary | ICD-10-CM | POA: Diagnosis not present

## 2020-02-14 ENCOUNTER — Other Ambulatory Visit: Payer: Self-pay | Admitting: Family Medicine

## 2020-02-29 ENCOUNTER — Encounter: Payer: Self-pay | Admitting: Dietician

## 2020-02-29 ENCOUNTER — Other Ambulatory Visit: Payer: Self-pay

## 2020-02-29 ENCOUNTER — Encounter: Payer: Federal, State, Local not specified - PPO | Attending: Family Medicine | Admitting: Dietician

## 2020-02-29 DIAGNOSIS — E119 Type 2 diabetes mellitus without complications: Secondary | ICD-10-CM | POA: Diagnosis not present

## 2020-02-29 NOTE — Progress Notes (Signed)
Patient was seen on 02/29/2020 for the first of a series of three diabetes self-management courses at the [facility].  Patient Education Plan per assessed needs and concerns is to attend three course education program for Diabetes Self Management Education.  The following learning objectives were met by the patient during this class:  Describe diabetes, types of diabetes and pathophysiology  State some common risk factors for diabetes  Defines the role of glucose and insulin  Describe the relationship between diabetes and cardiovascular and other risks  State the members of the Healthcare Team  States the rationale for glucose monitoring and when to test  State their individual Target Range  State the importance of logging glucose readings and how to interpret the readings  Identifies A1C target  Explain the correlation between A1c and eAG values  State symptoms and treatment of high blood glucose and low blood glucose  Explain proper technique for glucose testing and identify proper sharps disposal  Handouts given during class include:  How to Thrive:  A Guide for Your Journey with Diabetes by the ADA  Meal Plan Card and carbohydrate content list  Dietary intake form  Low Sodium Flavoring Tips  Types of Fats  Dining Out  Label reading  Snack list  Planning a balanced meal  The diabetes portion plate  Diabetes Resources  A1c to eAG Conversion Chart  Blood Glucose Log  Diabetes Recommended Care Schedule  Support Group  Diabetes Success Plan  Core Class Satisfaction Survey   Follow-Up Plan:  Attend core 2   

## 2020-03-02 ENCOUNTER — Other Ambulatory Visit: Payer: Federal, State, Local not specified - PPO

## 2020-03-06 ENCOUNTER — Telehealth: Payer: Self-pay | Admitting: Family Medicine

## 2020-03-06 DIAGNOSIS — E119 Type 2 diabetes mellitus without complications: Secondary | ICD-10-CM

## 2020-03-06 NOTE — Telephone Encounter (Signed)
-----   Message from Aquilla Solian, RT sent at 02/28/2020 10:30 AM EST ----- Regarding: Lab Orders for Wednesday 12.8.2021 Please place lab orders for Wednesday 12.8.2021, appt notes state "f/u labs" Thank you, Jones Bales RT(R)

## 2020-03-07 ENCOUNTER — Encounter: Payer: Federal, State, Local not specified - PPO | Attending: Family Medicine | Admitting: Dietician

## 2020-03-07 ENCOUNTER — Other Ambulatory Visit: Payer: Self-pay

## 2020-03-07 ENCOUNTER — Encounter: Payer: Self-pay | Admitting: Dietician

## 2020-03-07 DIAGNOSIS — E119 Type 2 diabetes mellitus without complications: Secondary | ICD-10-CM | POA: Insufficient documentation

## 2020-03-07 NOTE — Progress Notes (Signed)
Patient was seen on 03/07/2020 for the second of a series of three diabetes self-management courses at the [facility]. The following learning objectives were met by the patient during this class:   Describe the role of different macronutrients on glucose  Explain how carbohydrates affect blood glucose  State what foods contain the most carbohydrates  Demonstrate carbohydrate counting  Demonstrate how to read Nutrition Facts food label  Describe effects of various fats on heart health  Describe the importance of good nutrition for health and healthy eating strategies  Describe techniques for managing your shopping, cooking and meal planning  List strategies to follow meal plan when dining out  Describe the effects of alcohol on glucose and how to use it safely  Goals:  Follow Diabetes Meal Plan as instructed  Aim to spread carbs evenly throughout the day  Aim for 3 meals per day and snacks as needed Include lean protein foods to meals/snacks  Monitor glucose levels as instructed by your doctor   Patient's also asked questions re:  Supplement safety.  Provided handout from www.diabetesed.net which was adapted from www.[facility].com/suppreview.  Follow-Up Plan:  Attend Core 3  Work towards following your personal food plan.   

## 2020-03-08 ENCOUNTER — Other Ambulatory Visit (INDEPENDENT_AMBULATORY_CARE_PROVIDER_SITE_OTHER): Payer: Federal, State, Local not specified - PPO

## 2020-03-08 ENCOUNTER — Encounter: Payer: Self-pay | Admitting: Family Medicine

## 2020-03-08 ENCOUNTER — Ambulatory Visit: Payer: Federal, State, Local not specified - PPO | Admitting: Family Medicine

## 2020-03-08 VITALS — BP 116/80 | HR 75 | Temp 97.5°F | Ht 62.25 in | Wt 143.0 lb

## 2020-03-08 DIAGNOSIS — M1712 Unilateral primary osteoarthritis, left knee: Secondary | ICD-10-CM

## 2020-03-08 DIAGNOSIS — M2392 Unspecified internal derangement of left knee: Secondary | ICD-10-CM

## 2020-03-08 DIAGNOSIS — E119 Type 2 diabetes mellitus without complications: Secondary | ICD-10-CM

## 2020-03-08 LAB — LIPID PANEL
Cholesterol: 280 mg/dL — ABNORMAL HIGH (ref 0–200)
HDL: 54.2 mg/dL (ref >39.00)
LDL Cholesterol: 192 mg/dL — ABNORMAL HIGH (ref 0–99)
NonHDL: 225.8
Total CHOL/HDL Ratio: 5
Triglycerides: 170 mg/dL — ABNORMAL HIGH (ref 0.0–149.0)
VLDL: 34 mg/dL (ref 0.0–40.0)

## 2020-03-08 LAB — COMPREHENSIVE METABOLIC PANEL
ALT: 15 U/L (ref 0–35)
AST: 13 U/L (ref 0–37)
Albumin: 4.5 g/dL (ref 3.5–5.2)
Alkaline Phosphatase: 90 U/L (ref 39–117)
BUN: 11 mg/dL (ref 6–23)
CO2: 27 mEq/L (ref 19–32)
Calcium: 9.7 mg/dL (ref 8.4–10.5)
Chloride: 104 mEq/L (ref 96–112)
Creatinine, Ser: 0.79 mg/dL (ref 0.40–1.20)
GFR: 84.71 mL/min (ref >60.00)
Glucose, Bld: 194 mg/dL — ABNORMAL HIGH (ref 70–99)
Potassium: 4.2 mEq/L (ref 3.5–5.1)
Sodium: 139 mEq/L (ref 135–145)
Total Bilirubin: 0.5 mg/dL (ref 0.2–1.2)
Total Protein: 7 g/dL (ref 6.0–8.3)

## 2020-03-08 LAB — MICROALBUMIN / CREATININE URINE RATIO
Creatinine,U: 85.1 mg/dL
Microalb Creat Ratio: 1.1 mg/g (ref 0.0–30.0)
Microalb, Ur: 1 mg/dL (ref 0.0–1.9)

## 2020-03-08 LAB — HEMOGLOBIN A1C: Hgb A1c MFr Bld: 8.1 % — ABNORMAL HIGH (ref 4.6–6.5)

## 2020-03-08 MED ORDER — TRIAMCINOLONE ACETONIDE 40 MG/ML IJ SUSP
40.0000 mg | Freq: Once | INTRAMUSCULAR | Status: AC
  Administered 2020-03-08: 40 mg via INTRA_ARTICULAR

## 2020-03-08 NOTE — Patient Instructions (Signed)
Hip Rehab:  Hip Flexion: Toe up to ceiling, laying on your back. Lift your whole leg, 3 sets. Work up to being able to do #30 with each set.  Hip elevations, Toe and leg turned out to side.  Lift whole leg, 3 sets. Work up to being able to do #30 with each set.  Hip Abductions: Lying on side, straight out to side. 3 sets, work up to being able to do #30 with each set.  At the beginning you may only be able to do a lot less, try to do #10.  

## 2020-03-08 NOTE — Progress Notes (Signed)
Judith Peplinski T. Bow Buntyn, MD, CAQ Sports Medicine  Primary Care and Sports Medicine [facility] at [facility] [address] [place], [address]  Phone: [phone]  FAX: [phone]  Judith Garcia - 54 y.o. female  MRN [id number]  Date of Birth: 16-Sep-1965  Date: 03/08/2020  PCP: Excell Seltzer, MD  Referral: Excell Seltzer, MD  Chief Complaint  Patient presents with  . Follow-up    Left Leg/Knee    This visit occurred during the SARS-CoV-2 public health emergency.  Safety protocols were in place, including screening questions prior to the visit, additional usage of staff PPE, and extensive cleaning of exam room while observing appropriate contact time as indicated for disinfecting solutions.   Subjective:   Judith Garcia is a 54 y.o. very pleasant female patient with Body mass index is 25.95 kg/m. who presents with the following:  F/u L knee:    A little bit better only.  She has not been entirely diligent about doing her home rehab.  She predominantly still has pain in the posterior medial aspect of the knee.  Her motion is full.  She has never had an effusion.  She is stiff in the morning, she does not have problems getting out of a car or putting on her clothing.  On her prior films she did have only some minor OA changes only.  Int der and minor oa inj knee, L  02/02/2020 Last OV with Hannah Beat, MD  She is a very nice lady and she presents today with some left-sided leg pain versus knee pain and she is here today in consultation courtesy of my partner Dr. Ermalene Searing.   She has been having left knee pain for about the last 4 months.  In other additional treatments have not really helped all that much.  It had seemed to be doing better, but a few weeks ago she did have some more additional acute pain.   She is having pain both medially and laterally, but this is worse medially.  She also has some pain in the lower extremity as well caudally.   This is predominantly in the anterior shin and in much to a lesser degree posteriorly.   She has never had any bruising, and she has never had any major trauma to the knee, fractures, dislocations, or surgery.   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   She does use the elliptical and walks some every few days traditionally, but right now this is been quite hard.   She has avoided oral medication at this point, but she has used some topical remedies   Review of Systems is noted in the HPI, as appropriate   Objective:   BP 116/80   Pulse 75   Temp (!) 97.5 F (36.4 C) (Temporal)   Ht 5' 2.25" (1.581 m)   Wt 143 lb (64.9 kg)   LMP 06/14/2017   SpO2 99%   BMI 25.95 kg/m   Left knee: Full extension and flexion to 135 degrees.  No effusion.  No pain with loading the medial lateral patellar facets.  ACL, PCL, MCL, and LCL are all stable and intact.  Lachman is negative.  She does have tenderness on the posterior medial joint line, but not on the lateral joint line.  McMurray's and flexion pinch testing are both normal.  Bounce home testing  is normal.  Hip flexion and abduction are 4 out of 5 on the left compared to 5/5 on the right.  Abduction is 5/5 bilateral  Neurovascular intact  Radiology: DG Knee 4 Views W/Patella Left  Result Date: 02/03/2020 CLINICAL DATA:  Medial knee pain. EXAM: LEFT KNEE - COMPLETE 4+ VIEW COMPARISON:  None. FINDINGS: No acute fracture or dislocation. No joint effusion. Mild medial compartment joint space narrowing. Bone mineralization is normal. Soft tissues are unremarkable. Single weight-bearing frontal view of the right knee is unremarkable. IMPRESSION: 1. Mild medial compartment degenerative changes. Electronically Signed   By: Obie Dredge M.D.   On: 02/03/2020 15:51   Assessment and Plan:     ICD-10-CM   1. Internal derangement of left knee  M23.92  triamcinolone acetonide (KENALOG-40) injection 40 mg  2. Primary osteoarthritis of left knee  M17.12 triamcinolone acetonide (KENALOG-40) injection 40 mg  3. Type 2 diabetes mellitus without complication, without long-term current use of insulin (HCC)  E11.9 Hepatitis C antibody    Microalbumin / creatinine urine ratio    Comprehensive metabolic panel    Lipid panel    Hemoglobin A1c   She does have some posterior medial knee pain, she does have a setting of some mild medial compartmental osteoarthritis.  Posterior medial degenerative meniscal tear is certainly possible, and is plus or minus on the continuum with degenerative disease.  Suspect that mild arthritis exacerbation contributes.  I reviewed range of motion and strengthening with the patient, very basic.  We are going to inject her knee with some corticosteroid today and have her follow-up in 2 months.  Patient Instructions  Hip Rehab:  Hip Flexion: Toe up to ceiling, laying on your back. Lift your whole leg, 3 sets. Work up to being able to do #30 with each set.  Hip elevations, Toe and leg turned out to side.  Lift whole leg, 3 sets. Work up to being able to do #30 with each set.  Hip Abductions: Lying on side, straight out to side. 3 sets, work up to being able to do #30 with each set.  At the beginning you may only be able to do a lot less, try to do #10.     Aspiration/Injection Procedure Note Judith Garcia Jul 21, 1965 Date of procedure: 03/08/2020  Procedure: Large Joint Aspiration / Injection of Knee, L Indications: Pain  Procedure Details Patient verbally consented to procedure. Risks, benefits, and alternatives explained. Sterilely prepped with Chloraprep. Ethyl cholride used for anesthesia. 9 cc Lidocaine 1% mixed with 1 mL of Kenalog 40 mg injected using the anteromedial approach without difficulty. No complications with procedure and tolerated well. Patient had decreased pain post-injection. Medication: 1 mL of  Kenalog 40 mg   Meds ordered this encounter  Medications  . triamcinolone acetonide (KENALOG-40) injection 40 mg   There are no discontinued medications. No orders of the defined types were placed in this encounter.   Follow-up: No follow-ups on file.  Signed,  Elpidio Galea. Taja Pentland, MD   Outpatient Encounter Medications as of 03/08/2020  Medication Sig  . acetaminophen (TYLENOL) 325 MG tablet Take 2 tablets (650 mg total) by mouth every 6 (six) hours as needed for mild pain or headache (fever >/= 101).  . Flaxseed, Linseed, 1000 MG CAPS Take by mouth.  . Multiple Vitamins-Minerals (MULTI COMPLETE) CAPS Take by mouth.  Judith Garcia test strip TEST BLOOD SUGAR 4 TIMES A DAY AS DIRECTED  . Probiotic Product (PROBIOMAX DAILY DF PO) Take by mouth.  Marland Kitchen  metFORMIN (GLUCOPHAGE) 500 MG tablet Take 1 tablet (500 mg total) by mouth 2 (two) times daily with a meal. (Patient not taking: Reported on 02/02/2020)  . [EXPIRED] triamcinolone acetonide (KENALOG-40) injection 40 mg    No facility-administered encounter medications on file as of 03/08/2020.

## 2020-03-09 ENCOUNTER — Ambulatory Visit: Payer: Federal, State, Local not specified - PPO | Admitting: Family Medicine

## 2020-03-09 LAB — HEPATITIS C ANTIBODY
Hepatitis C Ab: NONREACTIVE
SIGNAL TO CUT-OFF: 0.01 (ref <1.00)

## 2020-03-09 NOTE — Progress Notes (Signed)
No critical labs need to be addressed urgently. We will discuss labs in detail at upcoming office visit.   

## 2020-03-14 ENCOUNTER — Encounter: Payer: Self-pay | Admitting: Dietician

## 2020-03-14 ENCOUNTER — Encounter: Payer: Federal, State, Local not specified - PPO | Admitting: Dietician

## 2020-03-14 ENCOUNTER — Other Ambulatory Visit: Payer: Self-pay

## 2020-03-14 DIAGNOSIS — E119 Type 2 diabetes mellitus without complications: Secondary | ICD-10-CM | POA: Diagnosis not present

## 2020-03-14 NOTE — Progress Notes (Signed)
Patient was seen on 03/14/2020 for the third of a series of three diabetes self-management courses at the [facility].   Janene Madeira the amount of activity recommended for healthy living . Describe activities suitable for individual needs . Identify ways to regularly incorporate activity into daily life . Identify barriers to activity and ways to over come these barriers  Identify diabetes medications being personally used and their primary action for lowering glucose and possible side effects . Describe role of stress on blood glucose and develop strategies to address psychosocial issues . Identify diabetes complications and ways to prevent them  Explain how to manage diabetes during illness . Evaluate success in meeting personal goal . Establish 2-3 goals that they will plan to diligently work on  Goals:   I will count my carb choices at most meals and snacks  I will be active 15 minutes or more 3 times a week  Your patient has identified these potential barriers to change:  Motivation  Your patient has identified their diabetes self-care support plan as   American Diabetes Association Website    Plan:  Attend Support Group as desired

## 2020-03-17 ENCOUNTER — Ambulatory Visit: Payer: Federal, State, Local not specified - PPO | Admitting: Family Medicine

## 2020-04-06 ENCOUNTER — Other Ambulatory Visit: Payer: Self-pay

## 2020-04-06 ENCOUNTER — Ambulatory Visit: Payer: Federal, State, Local not specified - PPO | Admitting: Family Medicine

## 2020-04-06 ENCOUNTER — Encounter: Payer: Self-pay | Admitting: Family Medicine

## 2020-04-06 VITALS — BP 140/80 | HR 107 | Temp 97.7°F | Ht 62.25 in | Wt 141.2 lb

## 2020-04-06 DIAGNOSIS — E785 Hyperlipidemia, unspecified: Secondary | ICD-10-CM

## 2020-04-06 DIAGNOSIS — E1169 Type 2 diabetes mellitus with other specified complication: Secondary | ICD-10-CM

## 2020-04-06 DIAGNOSIS — E119 Type 2 diabetes mellitus without complications: Secondary | ICD-10-CM

## 2020-04-06 DIAGNOSIS — Z9114 Patient's other noncompliance with medication regimen: Secondary | ICD-10-CM

## 2020-04-06 DIAGNOSIS — I152 Hypertension secondary to endocrine disorders: Secondary | ICD-10-CM

## 2020-04-06 DIAGNOSIS — E1159 Type 2 diabetes mellitus with other circulatory complications: Secondary | ICD-10-CM | POA: Diagnosis not present

## 2020-04-06 LAB — HM DIABETES FOOT EXAM

## 2020-04-06 NOTE — Assessment & Plan Note (Signed)
Improved at last 2 visits.Marland Kitchen likely elevated today from stress of doctor visit /white coat hypertension.  She will follow at home.

## 2020-04-06 NOTE — Assessment & Plan Note (Signed)
She understands risk of not being on statin in setting of DM.

## 2020-04-06 NOTE — Progress Notes (Signed)
Patient ID: Judith Garcia, female    DOB: [birth year], 55 y.o.   MRN: [id number]  This visit was conducted in person.  BP 140/80   Pulse (!) 107   Temp 97.7 F (36.5 C) (Temporal)   Ht 5' 2.25" (1.581 m)   Wt 141 lb 4 oz (64.1 kg)   LMP 06/14/2017   SpO2 98%   BMI 25.63 kg/m    CC: DM follow up Subjective:   HPI: Judith Garcia is a 55 y.o. female presenting on 04/06/2020 for Diabetes    Diabetes:   Inadequately controlled but improving with lifestyle ..she has had PNA, knee injury and steroids in the last 3-4 months.  She did not try the short acting metformin at all. Lab Results  Component Value Date   HGBA1C 8.1 (H) 03/08/2020  Using medications without difficulties: Hypoglycemic episodes: Hyperglycemic episodes: Feet problems: no ulcers Blood Sugars averaging: FBS  180-194, 2 hours after eating 110 eye exam within last year: yes 10/21  Wt Readings from Last 3 Encounters:  04/06/20 141 lb 4 oz (64.1 kg)  03/08/20 143 lb (64.9 kg)  02/29/20 144 lb (65.3 kg)     microalbuminuria negative  Elevated Cholesterol: LDL far from goal worsened from last check ... not on statin Lab Results  Component Value Date   CHOL 280 (H) 03/08/2020   HDL 54.20 03/08/2020   LDLCALC 192 (H) 03/08/2020   TRIG 170.0 (H) 03/08/2020   CHOLHDL 5 03/08/2020  Using medications without problems: Muscle aches:  Diet compliance: She had DM nutrition  Training in last several months. Exercise: she is limited given knee injury in 2021  given steroid injection in knee on 03/08/2020... nee is now better.. walking 1-2 a week. Other complaints:    Hypertension:  Borderline control in office today on no medication. BP Readings from Last 3 Encounters:  04/06/20 140/80  03/08/20 116/80  02/02/20 130/70  Using medication without problems or lightheadedness: none  Chest pain with exertion:none Edema:none Short of breath: Average home BPs: Other issues:  Reviewed labs in detail with  patient.     Relevant past medical, surgical, family and social history reviewed and updated as indicated. Interim medical history since our last visit reviewed. Allergies and medications reviewed and updated. Outpatient Medications Prior to Visit  Medication Sig Dispense Refill  . acetaminophen (TYLENOL) 325 MG tablet Take 2 tablets (650 mg total) by mouth every 6 (six) hours as needed for mild pain or headache (fever >/= 101).    . Flaxseed, Linseed, 1000 MG CAPS Take by mouth.    . Multiple Vitamins-Minerals (MULTI COMPLETE) CAPS Take by mouth.    Letta Pate VERIO test strip TEST BLOOD SUGAR 4 TIMES A DAY AS DIRECTED 125 strip 11  . Probiotic Product (PROBIOMAX DAILY DF PO) Take by mouth.    . metFORMIN (GLUCOPHAGE) 500 MG tablet Take 1 tablet (500 mg total) by mouth 2 (two) times daily with a meal. (Patient not taking: No sig reported) 60 tablet 11   No facility-administered medications prior to visit.     Per HPI unless specifically indicated in ROS section below Review of Systems Objective:  BP 140/80   Pulse (!) 107   Temp 97.7 F (36.5 C) (Temporal)   Ht 5' 2.25" (1.581 m)   Wt 141 lb 4 oz (64.1 kg)   LMP 06/14/2017   SpO2 98%   BMI 25.63 kg/m   Wt Readings from Last 3 Encounters:  04/06/20 141 lb 4  oz (64.1 kg)  03/08/20 143 lb (64.9 kg)  02/29/20 144 lb (65.3 kg)      Physical Exam Constitutional:      General: She is not in acute distress.Vital signs are normal.     Appearance: Normal appearance. She is well-developed and well-nourished. She is not ill-appearing or toxic-appearing.  HENT:     Head: Normocephalic.     Right Ear: Hearing, tympanic membrane, ear canal and external ear normal. Tympanic membrane is not erythematous, retracted or bulging.     Left Ear: Hearing, tympanic membrane, ear canal and external ear normal. Tympanic membrane is not erythematous, retracted or bulging.     Nose: No mucosal edema or rhinorrhea.     Right Sinus: No maxillary sinus  tenderness or frontal sinus tenderness.     Left Sinus: No maxillary sinus tenderness or frontal sinus tenderness.     Mouth/Throat:     Mouth: Oropharynx is clear and moist and mucous membranes are normal.     Pharynx: Uvula midline.  Eyes:     General: Lids are normal. Lids are everted, no foreign bodies appreciated.     Extraocular Movements: EOM normal.     Conjunctiva/sclera: Conjunctivae normal.     Pupils: Pupils are equal, round, and reactive to light.  Neck:     Thyroid: No thyroid mass or thyromegaly.     Vascular: No carotid bruit.     Trachea: Trachea normal.  Cardiovascular:     Rate and Rhythm: Normal rate and regular rhythm.     Pulses: Normal pulses and intact distal pulses.     Heart sounds: Normal heart sounds, S1 normal and S2 normal. No murmur heard. No friction rub. No gallop.   Pulmonary:     Effort: Pulmonary effort is normal. No tachypnea or respiratory distress.     Breath sounds: Normal breath sounds. No decreased breath sounds, wheezing, rhonchi or rales.  Abdominal:     General: Bowel sounds are normal.     Palpations: Abdomen is soft.     Tenderness: There is no abdominal tenderness.  Musculoskeletal:     Cervical back: Normal range of motion and neck supple.  Skin:    General: Skin is warm, dry and intact.     Findings: No rash.  Neurological:     Mental Status: She is alert.  Psychiatric:        Mood and Affect: Mood is not anxious or depressed.        Speech: Speech normal.        Behavior: Behavior normal. Behavior is cooperative.        Thought Content: Thought content normal.        Cognition and Memory: Cognition and memory normal.        Judgment: Judgment normal.      Diabetic foot exam: Normal inspection No skin breakdown No calluses  Normal DP pulses Normal sensation to light touch and monofilament Nails normal     Results for orders placed or performed in visit on 03/08/20  Hepatitis C antibody  Result Value Ref Range    Hepatitis C Ab NON-REACTIVE NON-REACTI   SIGNAL TO CUT-OFF 0.01 <1.00  Microalbumin / creatinine urine ratio  Result Value Ref Range   Microalb, Ur 1.0 0.0 - 1.9 mg/dL   Creatinine,U 85.1 mg/dL   Microalb Creat Ratio 1.1 0.0 - 30.0 mg/g  Comprehensive metabolic panel  Result Value Ref Range   Sodium 139 135 - 145 mEq/L  Potassium 4.2 3.5 - 5.1 mEq/L   Chloride 104 96 - 112 mEq/L   CO2 27 19 - 32 mEq/L   Glucose, Bld 194 (H) 70 - 99 mg/dL   BUN 11 6 - 23 mg/dL   Creatinine, Ser 7.42 0.40 - 1.20 mg/dL   Total Bilirubin 0.5 0.2 - 1.2 mg/dL   Alkaline Phosphatase 90 39 - 117 U/L   AST 13 0 - 37 U/L   ALT 15 0 - 35 U/L   Total Protein 7.0 6.0 - 8.3 g/dL   Albumin 4.5 3.5 - 5.2 g/dL   GFR 59.56 >38.75 mL/min   Calcium 9.7 8.4 - 10.5 mg/dL  Lipid panel  Result Value Ref Range   Cholesterol 280 (H) 0 - 200 mg/dL   Triglycerides 643.3 (H) 0.0 - 149.0 mg/dL   HDL 29.51 >88.41 mg/dL   VLDL 66.0 0.0 - 63.0 mg/dL   LDL Cholesterol 160 (H) 0 - 99 mg/dL   Total CHOL/HDL Ratio 5    NonHDL 225.80   Hemoglobin A1c  Result Value Ref Range   Hgb A1c MFr Bld 8.1 (H) 4.6 - 6.5 %    This visit occurred during the SARS-CoV-2 public health emergency.  Safety protocols were in place, including screening questions prior to the visit, additional usage of staff PPE, and extensive cleaning of exam room while observing appropriate contact time as indicated for disinfecting solutions.   COVID 19 screen:  No recent travel or known exposure to COVID19 The patient denies respiratory symptoms of COVID 19 at this time. The importance of social distancing was discussed today.   Assessment and Plan    Problem List Items Addressed This Visit    Hyperlipidemia associated with type 2 diabetes mellitus (HCC) - Primary (Chronic)    Stain indicated.. pt refused despite need.  She will look into zetia but is overall "against" any regular medicaitons.      Hypertension associated with diabetes (HCC) (Chronic)     Improved at last 2 visits.Marland Kitchen likely elevated today from stress of doctor visit /white coat hypertension.  She will follow at home.      Patient noncompliant with statin medication    She understands risk of not being on statin in setting of DM.      Type 2 diabetes mellitus without complication, without long-term current use of insulin (HCC) (Chronic)    Poorly  Controlled  but improving.   Spent lengthy discussion on DM and recommendations. Pt is very hesitant about any type of medication at all. Worried about side effects.  She will continue to work on lifestyle changes and voices desire to not be continually pushes into use diabetes med, BOP med or statin despite recommendations.          Kerby Nora, MD

## 2020-04-06 NOTE — Assessment & Plan Note (Addendum)
Poorly  Controlled  but improving.   Spent lengthy discussion on DM and recommendations. Pt is very hesitant about any type of medication at all. Worried about side effects.  She will continue to work on lifestyle changes and voices desire to not be continually pushes into use diabetes med, BOP med or statin despite recommendations.

## 2020-04-06 NOTE — Patient Instructions (Addendum)
Increase walking 3-5 times a week.  Follow blood pressure at home... goal <140/90.  The diagnosis of insulin resistance in most patients is based upon clinical findings (eg, metabolic syndrome traits): ?Hyperglycemia ?Dyslipidemia ?Abdominal obesity ?Hypertension In a clinical setting, it would be useful to quantify insulin resistance in obese patients if they have not yet been diagnosed with diabetes as they are at highest risk for the development of type 2 diabetes mellitus and its complications [26], cardiovascular disease (CVD), and certain malignancies associated with obesity and insulin resistance (eg, colon, breast, and endometrial cancers). However, there is currently no acceptable test for measuring insulin resistance in a clinical setting.

## 2020-04-06 NOTE — Assessment & Plan Note (Signed)
Stain indicated.. pt refused despite need.  She will look into zetia but is overall "against" any regular medicaitons.

## 2020-05-05 ENCOUNTER — Ambulatory Visit: Payer: Federal, State, Local not specified - PPO | Admitting: Family Medicine

## 2020-05-10 ENCOUNTER — Ambulatory Visit: Payer: Federal, State, Local not specified - PPO | Admitting: Family Medicine

## 2020-06-06 IMAGING — US US PELVIS COMPLETE TRANSABD/TRANSVAG
1 series · 13 of 25 positions shown · non-contrast
Comparison: None

CLINICAL DATA: Menorrhagia

EXAM:
TRANSABDOMINAL AND TRANSVAGINAL ULTRASOUND OF PELVIS
TECHNIQUE: Both transabdominal and transvaginal ultrasound examinations of the
pelvis were performed. Transabdominal technique was performed for
global imaging of the pelvis including uterus, ovaries, adnexal
regions, and pelvic cul-de-sac. It was necessary to proceed with
endovaginal exam following the transabdominal exam to visualize the
uterus endometrium ovaries.

[Series 1: us pelvis complete transabd/transvag · 0.25mm/px · 13 of 39 slices shown]
[im 1/39]
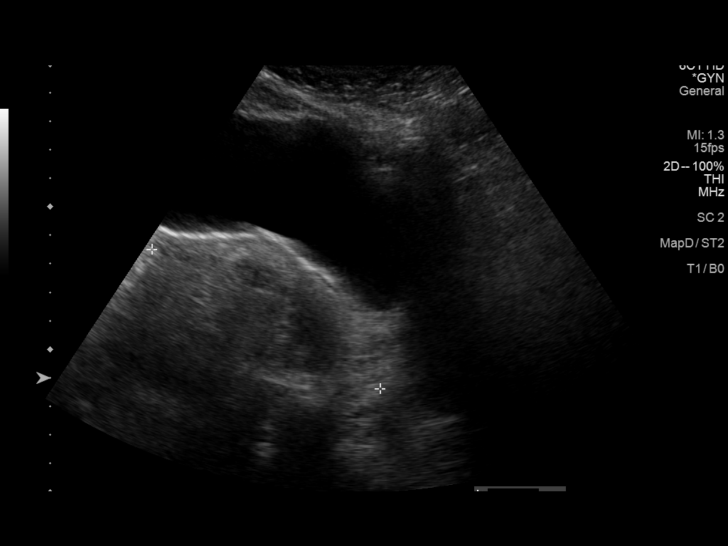
[im 4/39]
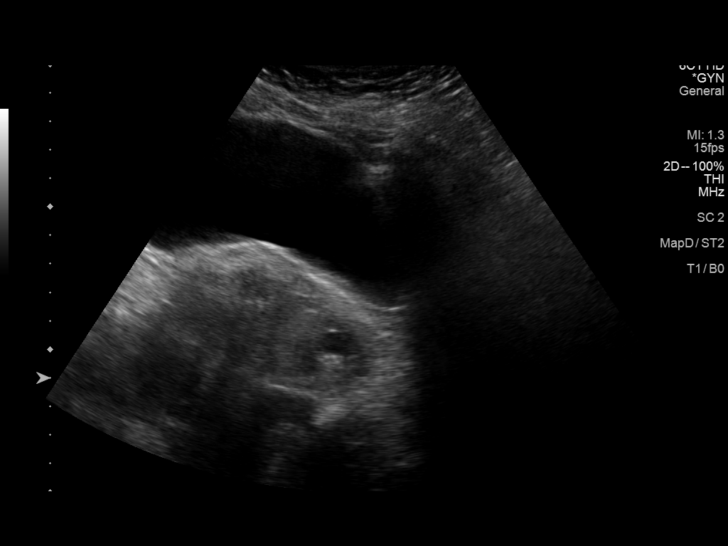
[im 7/39]
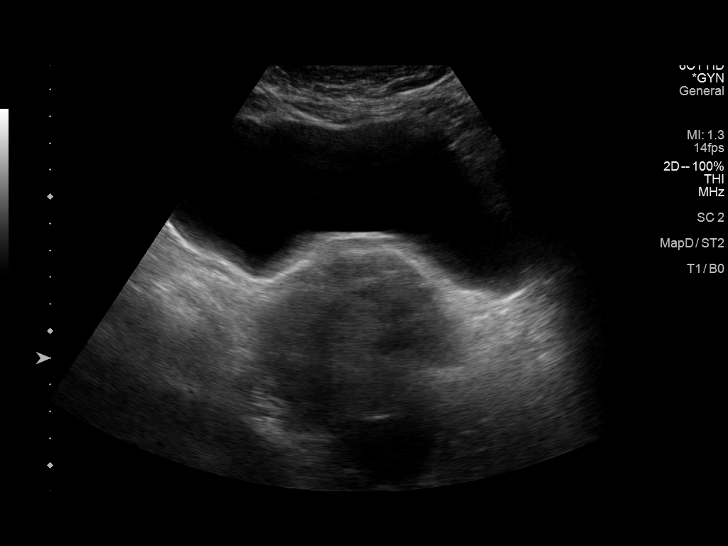
[im 10/39]
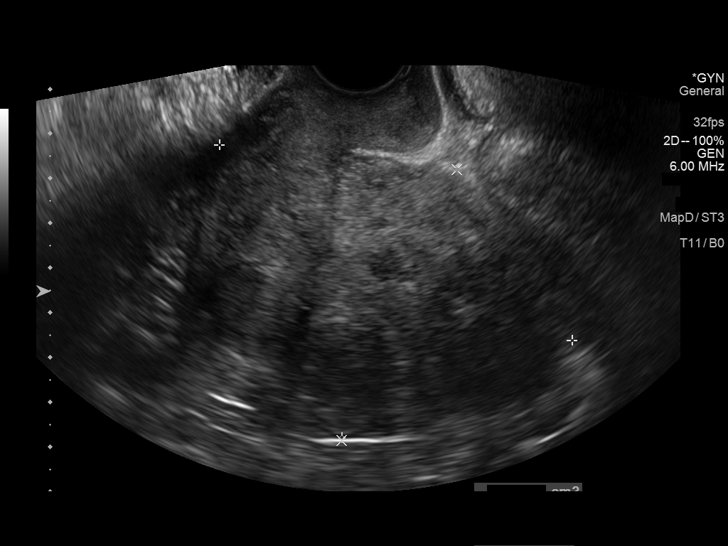
[im 13/39]
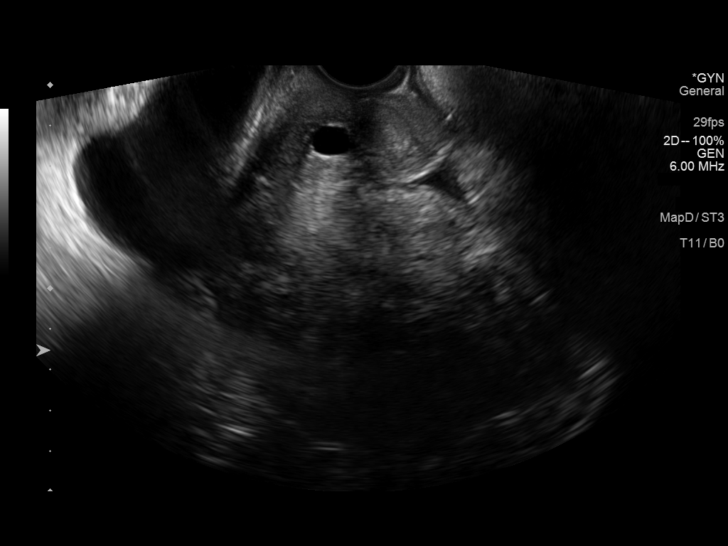
[im 16/39]
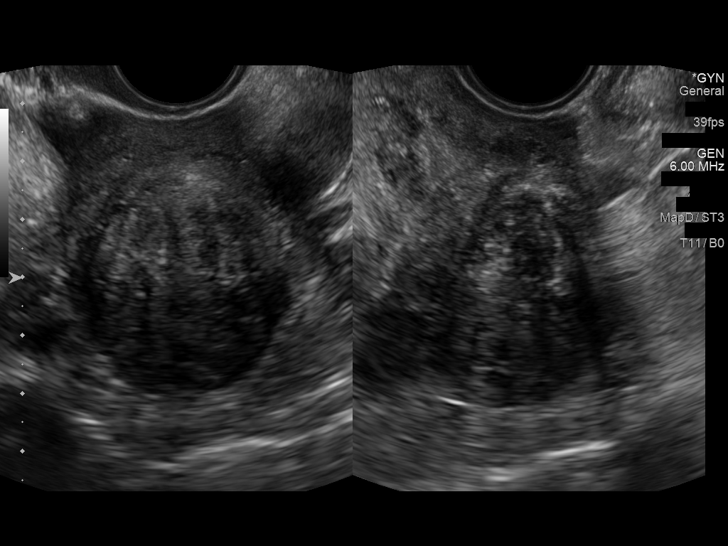
[im 20/39]
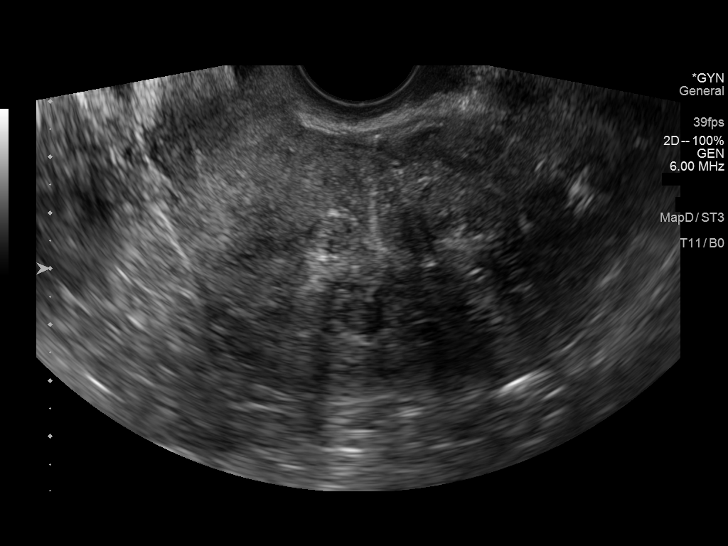
[im 23/39]
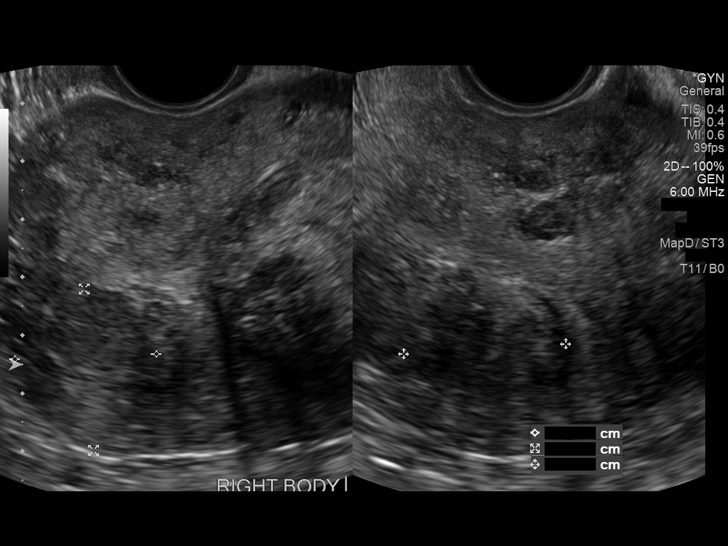
[im 26/39]
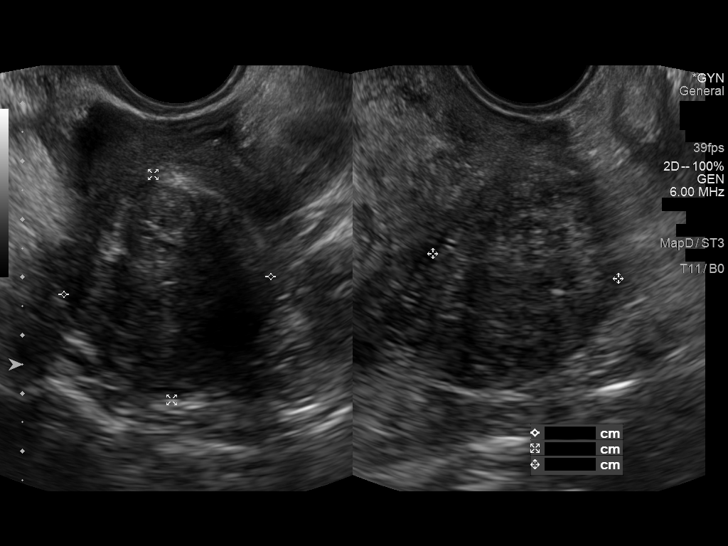
[im 29/39]
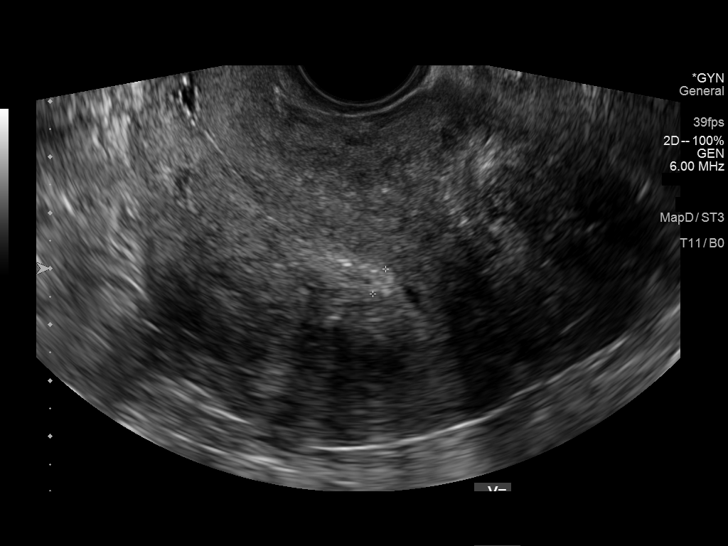
[im 32/39]
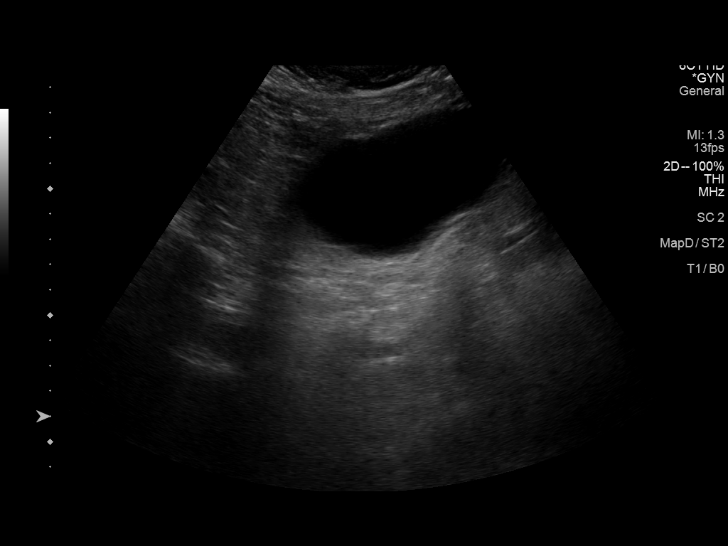
[im 35/39]
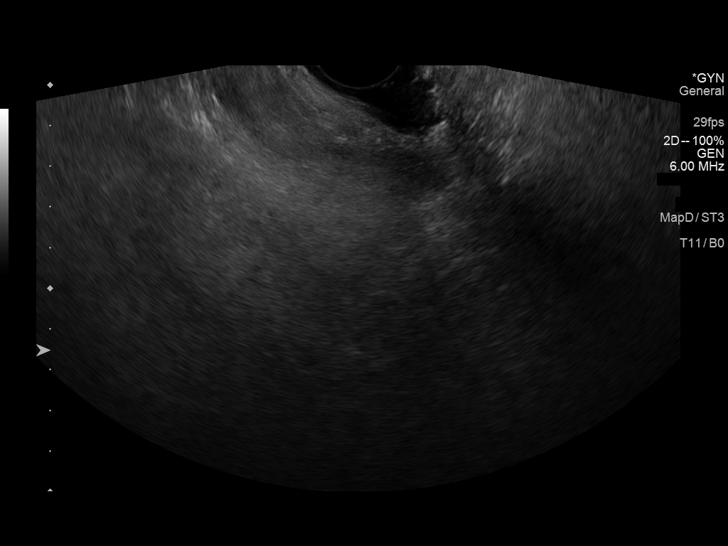
[im 39/39]
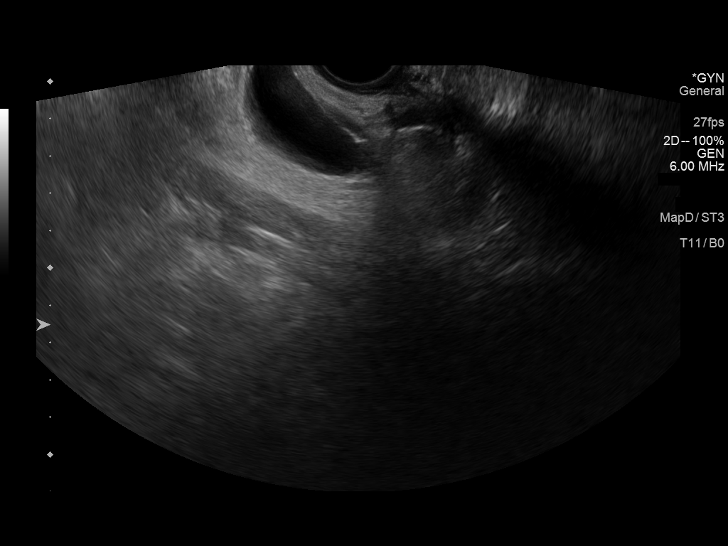

[13 of 25 positions shown; findings below may reference images not displayed]

FINDINGS: Uterus

Measurements: 9 cm length by 6.6 cm height by 8.1 cm wide = volume:
250 mL. Multiple myometrial masses consistent with fibroids. The
largest were measured.

Fundal fibroid measures 3 x 2.4 x 1.9 cm. Adjacent fundal fibroid
measuring 2.9 x 3.4 x 3.4 cm. Left uterine wall fibroid measures
x 3.7 x 4 cm. Right posterior fibroid measures 2.4 x 2.8 x 2.8 cm.
Submucosal lower uterine segment fibroid measuring 3.6 x 3.9 x
cm.

Endometrium

Thickness: 5 mm.  No focal abnormality visualized.

Right ovary

Not seen

Left ovary

Not seen

Other findings

No abnormal free fluid.
IMPRESSION: 1. Endometrial thickness of 5 mm. If bleeding remains unresponsive
to hormonal or medical therapy, sonohysterogram should be considered
for focal lesion work-up. (Ref: Radiological Reasoning: Algorithmic
Workup of Abnormal Vaginal Bleeding with Endovaginal Sonography and
Sonohysterography. [id number]; 191:[id number])
2. Enlarged uterus with multiple fibroids.

## 2020-07-04 ENCOUNTER — Ambulatory Visit: Payer: Federal, State, Local not specified - PPO | Admitting: Registered"

## 2020-07-07 ENCOUNTER — Other Ambulatory Visit: Payer: Federal, State, Local not specified - PPO

## 2020-07-13 ENCOUNTER — Ambulatory Visit: Payer: Federal, State, Local not specified - PPO | Admitting: Family Medicine

## 2021-05-30 ENCOUNTER — Other Ambulatory Visit: Payer: Self-pay | Admitting: Family Medicine

## 2021-05-30 ENCOUNTER — Telehealth: Payer: Self-pay | Admitting: Family Medicine

## 2021-05-30 NOTE — Telephone Encounter (Signed)
Pt called stating that she would like a referral to get a mammogram. Please advise ?

## 2021-05-31 ENCOUNTER — Encounter: Payer: Self-pay | Admitting: Family Medicine

## 2021-05-31 NOTE — Telephone Encounter (Signed)
CPX due.. sent message to pt via MyChart. ?

## 2021-05-31 NOTE — Telephone Encounter (Signed)
Please call and schedule CPE with fasting labs prior with Dr. Bedsole. 

## 2021-06-04 ENCOUNTER — Ambulatory Visit: Payer: Federal, State, Local not specified - PPO | Admitting: Family

## 2021-06-12 ENCOUNTER — Encounter: Payer: Self-pay | Admitting: Family

## 2021-06-12 ENCOUNTER — Other Ambulatory Visit: Payer: Self-pay

## 2021-06-12 ENCOUNTER — Ambulatory Visit: Payer: Federal, State, Local not specified - PPO | Admitting: Family

## 2021-06-12 VITALS — BP 162/90 | HR 92 | Ht 63.0 in | Wt 159.0 lb

## 2021-06-12 DIAGNOSIS — M62830 Muscle spasm of back: Secondary | ICD-10-CM

## 2021-06-12 DIAGNOSIS — N6321 Unspecified lump in the left breast, upper outer quadrant: Secondary | ICD-10-CM | POA: Diagnosis not present

## 2021-06-12 DIAGNOSIS — N644 Mastodynia: Secondary | ICD-10-CM | POA: Diagnosis not present

## 2021-06-12 DIAGNOSIS — N6314 Unspecified lump in the right breast, lower inner quadrant: Secondary | ICD-10-CM

## 2021-06-12 NOTE — Assessment & Plan Note (Signed)
Apply heat to site, muscle spasm ?Massage as able  ?Ibuprofen/tylenol prn ?

## 2021-06-12 NOTE — Progress Notes (Signed)
? ?Established Patient Office Visit ? ?Subjective:  ?Patient ID: Judith Garcia, female    DOB: 1965-06-27  Age: 56 y.o. MRN: [id number] ? ?CC:  ?Chief Complaint  ?Patient presents with  ? Breast Pain  ?   ---pain left  lateral side of the breast.   ? ? ?HPI ?Judith Garcia is here today with concerns.  ? ?Went to schedule mammogram, and was asked if she had any breast pain. She did.  ?She has had about two weeks of left lateral breast pain. She only feels the pain really when she presses the left side, more so under the arm pit under left breast.  ? ?No nipple discharge, no rash.  ? ?No h/o known breast cancer in family.  ? ?Past Medical History:  ?Diagnosis Date  ? Anemia   ? Anxiety   ? Gestational diabetes   ? Hyperlipidemia   ? ? ?Past Surgical History:  ?Procedure Laterality Date  ? CESAREAN SECTION    ? ceserean    ? CHOLECYSTECTOMY    ? ? ?Family History  ?Problem Relation Age of Onset  ? Diabetes Mother   ? Alcohol abuse Father   ? Heart disease Sister   ? Breast cancer Neg Hx   ? ? ?Social History  ? ?Socioeconomic History  ? Marital status: Married  ?  Spouse name: Not on file  ? Number of children: Not on file  ? Years of education: Not on file  ? Highest education level: Not on file  ?Occupational History  ? Not on file  ?Tobacco Use  ? Smoking status: Never  ? Smokeless tobacco: Never  ?Substance and Sexual Activity  ? Alcohol use: No  ? Drug use: No  ? Sexual activity: Not on file  ?Other Topics Concern  ? Not on file  ?Social History Narrative  ? Not on file  ? ?Social Determinants of Health  ? ?Financial Resource Strain: Not on file  ?Food Insecurity: Not on file  ?Transportation Needs: Not on file  ?Physical Activity: Not on file  ?Stress: Not on file  ?Social Connections: Not on file  ?Intimate Partner Violence: Not on file  ? ? ?Outpatient Medications Prior to Visit  ?Medication Sig Dispense Refill  ? Multiple Vitamins-Minerals (MULTI COMPLETE) CAPS Take by mouth.    ? acetaminophen (TYLENOL)  325 MG tablet Take 2 tablets (650 mg total) by mouth every 6 (six) hours as needed for mild pain or headache (fever >/= 101). (Patient not taking: Reported on 06/12/2021)    ? Flaxseed, Linseed, 1000 MG CAPS Take by mouth. (Patient not taking: Reported on 06/12/2021)    ? ONETOUCH VERIO test strip TEST BLOOD SUGAR 4 TIMES A DAY AS DIRECTED (Patient not taking: Reported on 06/12/2021) 125 strip 11  ? Probiotic Product (PROBIOMAX DAILY DF PO) Take by mouth. (Patient not taking: Reported on 06/12/2021)    ? ?No facility-administered medications prior to visit.  ? ? ?No Known Allergies ? ?ROS ?Review of Systems ? ?  ?Objective:  ?  ?Physical Exam ?Constitutional:   ?   General: She is not in acute distress. ?   Appearance: Normal appearance. She is obese. She is not ill-appearing, toxic-appearing or diaphoretic.  ?Pulmonary:  ?   Effort: Pulmonary effort is normal.  ?Musculoskeletal:  ?   Thoracic back: Tenderness present.  ?     Back: ? ?Neurological:  ?   Mental Status: She is alert.  ? ?Breast: Left breast tenderness and density palpated left  upper outer quadrant as well as some right breast tenderness right inner lower quadrant on palpation. No nipple discharge. No rashes.  ? ?BP (!) 162/90   Pulse 92   Ht 5\' 3"  (1.6 m)   Wt 159 lb (72.1 kg)   LMP 06/14/2017   SpO2 96%   BMI 28.17 kg/m?  ?Wt Readings from Last 3 Encounters:  ?06/12/21 159 lb (72.1 kg)  ?04/06/20 141 lb 4 oz (64.1 kg)  ?03/08/20 143 lb (64.9 kg)  ? ? ? ?Health Maintenance Due  ?Topic Date Due  ? Zoster Vaccines- Shingrix (1 of 2) Never done  ? MAMMOGRAM  04/13/2020  ? COVID-19 Vaccine (3 - Booster for Pfizer series) 04/26/2020  ? HEMOGLOBIN A1C  09/06/2020  ? INFLUENZA VACCINE  Never done  ? OPHTHALMOLOGY EXAM  01/17/2021  ? URINE MICROALBUMIN  03/08/2021  ? FOOT EXAM  04/06/2021  ? ? ?There are no preventive care reminders to display for this patient. ? ?Lab Results  ?Component Value Date  ? TSH 1.36 01/15/2018  ? ?Lab Results  ?Component Value  Date  ? WBC 8.3 12/09/2019  ? HGB 12.2 12/09/2019  ? HCT 37.4 12/09/2019  ? MCV 72.3 (L) 12/09/2019  ? PLT 486 (H) 12/09/2019  ? ?Lab Results  ?Component Value Date  ? NA 139 03/08/2020  ? K 4.2 03/08/2020  ? CO2 27 03/08/2020  ? GLUCOSE 194 (H) 03/08/2020  ? BUN 11 03/08/2020  ? CREATININE 0.79 03/08/2020  ? BILITOT 0.5 03/08/2020  ? ALKPHOS 90 03/08/2020  ? AST 13 03/08/2020  ? ALT 15 03/08/2020  ? PROT 7.0 03/08/2020  ? ALBUMIN 4.5 03/08/2020  ? CALCIUM 9.7 03/08/2020  ? ANIONGAP 9 12/09/2019  ? GFR 84.71 03/08/2020  ? ?Lab Results  ?Component Value Date  ? HGBA1C 8.1 (H) 03/08/2020  ? ? ?  ?Assessment & Plan:  ? ?Problem List Items Addressed This Visit   ? ?  ? Other  ? Back muscle spasm  ?  Apply heat to site, muscle spasm ?Massage as able  ?Ibuprofen/tylenol prn ?  ?  ? Mass of lower inner quadrant of right breast  ?  bil dx mammo and bil breast u/s ordered  ?Pending results  ?D/w pt cystic breasts, reduce caffeine as able ?  ?  ? Relevant Orders  ? MM DIAG BREAST TOMO BILATERAL  ? 14/10/2019 BREAST LTD UNI RIGHT INC AXILLA  ? Mass of upper outer quadrant of left breast  ?  Suspected cystic density ?bil dx mammo and bil breast u/s ordered  ?Pending results  ?D/w pt cystic breasts, reduce caffeine as able ?  ?  ? Relevant Orders  ? MM DIAG BREAST TOMO BILATERAL  ? US BREAST LTD UNI LEFT INC AXILLA  ? Pain of left breast - Primary  ?  bil dx mammo and bil breast u/s ordered  ?Pending results  ?D/w pt cystic breasts, reduce caffeine as able ?  ?  ? Relevant Orders  ? MM DIAG BREAST TOMO BILATERAL  ? US BREAST LTD UNI LEFT INC AXILLA  ? ? ?No orders of the defined types were placed in this encounter. ? ? ?Follow-up: Return if symptoms worsen or fail to improve.  ? ? ?[place], FNP ?

## 2021-06-12 NOTE — Patient Instructions (Signed)
A diagnostic test was ordered for today, and requested to be performed at [facility]. Orders are for bilateral diagnostic breast mammogram and as well as bilateral breast ultrasounds.  ? ?I have sent the order over to the facility.  ?Please give this center a call, and schedule your appointment to have this test completed. Once results are received, I will be in touch.  ? ?It was a pleasure seeing you today! Please do not hesitate to reach out with any questions and or concerns. ? ?Regards,  ? ?Kalla Watson ?FNP-C ? ? ? ?

## 2021-06-12 NOTE — Assessment & Plan Note (Signed)
bil dx mammo and bil breast u/s ordered  ?Pending results  ?D/w pt cystic breasts, reduce caffeine as able ?

## 2021-06-12 NOTE — Assessment & Plan Note (Signed)
bil dx mammo and bil breast u/s ordered  ?Pending results  ?D/w pt cystic breasts, reduce caffeine as able ?

## 2021-06-12 NOTE — Assessment & Plan Note (Signed)
Suspected cystic density ?bil dx mammo and bil breast u/s ordered  ?Pending results  ?D/w pt cystic breasts, reduce caffeine as able ?

## 2021-06-27 ENCOUNTER — Ambulatory Visit
Admission: RE | Admit: 2021-06-27 | Discharge: 2021-06-27 | Disposition: A | Discharge status: Home / Self Care | Payer: Federal, State, Local not specified - PPO | Source: Ambulatory Visit | Attending: Family | Admitting: Family

## 2021-06-27 DIAGNOSIS — N6321 Unspecified lump in the left breast, upper outer quadrant: Secondary | ICD-10-CM

## 2021-06-27 DIAGNOSIS — N6314 Unspecified lump in the right breast, lower inner quadrant: Secondary | ICD-10-CM

## 2021-06-27 DIAGNOSIS — R922 Inconclusive mammogram: Secondary | ICD-10-CM | POA: Diagnosis not present

## 2021-06-27 DIAGNOSIS — N644 Mastodynia: Secondary | ICD-10-CM

## 2021-07-23 ENCOUNTER — Telehealth: Payer: Self-pay | Admitting: Family Medicine

## 2021-07-23 DIAGNOSIS — D509 Iron deficiency anemia, unspecified: Secondary | ICD-10-CM

## 2021-07-23 DIAGNOSIS — E119 Type 2 diabetes mellitus without complications: Secondary | ICD-10-CM

## 2021-07-23 NOTE — Telephone Encounter (Signed)
-----   Message from Ronalee Red, RT sent at 07/16/2021  9:16 AM EDT ----- ?Regarding: Lab Tue 07/31/21 ?Patient is scheduled for cpx, please order future labs.  Thanks, Jae Dire  ? ?

## 2021-07-30 ENCOUNTER — Telehealth: Payer: Self-pay

## 2021-07-30 NOTE — Telephone Encounter (Signed)
Called and spoke to pt, and she stated she think she read it on the place did her mammography notes not [place]. ?

## 2021-07-31 ENCOUNTER — Other Ambulatory Visit: Payer: Federal, State, Local not specified - PPO

## 2021-07-31 NOTE — Telephone Encounter (Signed)
She stated about a error that was on her mychart, and I had to call her and let her know what your note said vs what she seen on mychart. ?

## 2021-08-07 ENCOUNTER — Encounter: Payer: Federal, State, Local not specified - PPO | Admitting: Family Medicine

## 2021-11-30 DIAGNOSIS — I10 Essential (primary) hypertension: Secondary | ICD-10-CM | POA: Diagnosis not present

## 2021-11-30 DIAGNOSIS — Z0000 Encounter for general adult medical examination without abnormal findings: Secondary | ICD-10-CM | POA: Diagnosis not present

## 2021-11-30 DIAGNOSIS — E785 Hyperlipidemia, unspecified: Secondary | ICD-10-CM | POA: Diagnosis not present

## 2021-11-30 DIAGNOSIS — E1169 Type 2 diabetes mellitus with other specified complication: Secondary | ICD-10-CM | POA: Diagnosis not present

## 2021-11-30 DIAGNOSIS — E663 Overweight: Secondary | ICD-10-CM | POA: Diagnosis not present

## 2022-06-18 DIAGNOSIS — E785 Hyperlipidemia, unspecified: Secondary | ICD-10-CM | POA: Diagnosis not present

## 2022-06-18 DIAGNOSIS — I10 Essential (primary) hypertension: Secondary | ICD-10-CM | POA: Diagnosis not present

## 2022-06-18 DIAGNOSIS — Z5320 Procedure and treatment not carried out because of patient's decision for unspecified reasons: Secondary | ICD-10-CM | POA: Diagnosis not present

## 2022-06-18 DIAGNOSIS — E1169 Type 2 diabetes mellitus with other specified complication: Secondary | ICD-10-CM | POA: Diagnosis not present

## 2022-07-03 ENCOUNTER — Other Ambulatory Visit: Payer: Self-pay | Admitting: Family Medicine

## 2022-07-03 DIAGNOSIS — Z1231 Encounter for screening mammogram for malignant neoplasm of breast: Secondary | ICD-10-CM

## 2022-07-22 DIAGNOSIS — E1165 Type 2 diabetes mellitus with hyperglycemia: Secondary | ICD-10-CM | POA: Diagnosis not present

## 2022-07-22 DIAGNOSIS — I10 Essential (primary) hypertension: Secondary | ICD-10-CM | POA: Diagnosis not present

## 2022-08-20 ENCOUNTER — Ambulatory Visit: Payer: Federal, State, Local not specified - PPO

## 2022-09-06 ENCOUNTER — Ambulatory Visit
Admission: RE | Admit: 2022-09-06 | Discharge: 2022-09-06 | Disposition: A | Discharge status: Home / Self Care | Payer: Federal, State, Local not specified - PPO | Source: Ambulatory Visit | Attending: Family Medicine | Admitting: Family Medicine

## 2022-09-06 DIAGNOSIS — Z1231 Encounter for screening mammogram for malignant neoplasm of breast: Secondary | ICD-10-CM

## 2023-01-21 DIAGNOSIS — Z5320 Procedure and treatment not carried out because of patient's decision for unspecified reasons: Secondary | ICD-10-CM | POA: Diagnosis not present

## 2023-01-21 DIAGNOSIS — E1159 Type 2 diabetes mellitus with other circulatory complications: Secondary | ICD-10-CM | POA: Diagnosis not present

## 2023-01-21 DIAGNOSIS — E785 Hyperlipidemia, unspecified: Secondary | ICD-10-CM | POA: Diagnosis not present

## 2023-01-21 DIAGNOSIS — I10 Essential (primary) hypertension: Secondary | ICD-10-CM | POA: Diagnosis not present

## 2023-03-13 DIAGNOSIS — R7309 Other abnormal glucose: Secondary | ICD-10-CM | POA: Diagnosis not present

## 2023-06-11 DIAGNOSIS — I10 Essential (primary) hypertension: Secondary | ICD-10-CM | POA: Diagnosis not present

## 2023-06-11 DIAGNOSIS — Z124 Encounter for screening for malignant neoplasm of cervix: Secondary | ICD-10-CM | POA: Diagnosis not present

## 2023-06-11 DIAGNOSIS — R87610 Atypical squamous cells of undetermined significance on cytologic smear of cervix (ASC-US): Secondary | ICD-10-CM | POA: Diagnosis not present

## 2023-06-11 DIAGNOSIS — Z01419 Encounter for gynecological examination (general) (routine) without abnormal findings: Secondary | ICD-10-CM | POA: Diagnosis not present

## 2023-09-02 DIAGNOSIS — E1165 Type 2 diabetes mellitus with hyperglycemia: Secondary | ICD-10-CM | POA: Diagnosis not present

## 2023-09-02 DIAGNOSIS — E785 Hyperlipidemia, unspecified: Secondary | ICD-10-CM | POA: Diagnosis not present

## 2023-09-02 DIAGNOSIS — E1159 Type 2 diabetes mellitus with other circulatory complications: Secondary | ICD-10-CM | POA: Diagnosis not present

## 2023-09-02 DIAGNOSIS — E559 Vitamin D deficiency, unspecified: Secondary | ICD-10-CM | POA: Diagnosis not present

## 2023-09-02 DIAGNOSIS — D509 Iron deficiency anemia, unspecified: Secondary | ICD-10-CM | POA: Diagnosis not present

## 2023-09-02 DIAGNOSIS — I10 Essential (primary) hypertension: Secondary | ICD-10-CM | POA: Diagnosis not present

## 2023-09-08 ENCOUNTER — Encounter: Payer: Self-pay | Admitting: Family Medicine

## 2023-09-08 ENCOUNTER — Other Ambulatory Visit: Payer: Self-pay | Admitting: Family Medicine

## 2023-09-08 DIAGNOSIS — Z0000 Encounter for general adult medical examination without abnormal findings: Secondary | ICD-10-CM

## 2023-09-08 DIAGNOSIS — Z1231 Encounter for screening mammogram for malignant neoplasm of breast: Secondary | ICD-10-CM

## 2023-09-10 ENCOUNTER — Ambulatory Visit
Admission: RE | Admit: 2023-09-10 | Discharge: 2023-09-10 | Disposition: A | Discharge status: Home / Self Care | Source: Ambulatory Visit | Attending: Family Medicine | Admitting: Family Medicine

## 2023-09-10 DIAGNOSIS — Z1231 Encounter for screening mammogram for malignant neoplasm of breast: Secondary | ICD-10-CM | POA: Diagnosis not present

## 2023-09-15 IMAGING — MG DIGITAL DIAGNOSTIC BILAT W/ TOMO W/ CAD
6 of 10 series · 6 of 30 positions shown · non-contrast
Comparison: Previous exam(s).

CLINICAL DATA: 55-year-old with nonfocal outer LEFT breast pain for
approximately 6 months which has recently resolved. Possible
palpable lumps in both breasts on recent clinical examination, UPPER
OUTER QUADRANT on the LEFT and LOWER INNER QUADRANT on the RIGHT.



[R CC synth-2D]
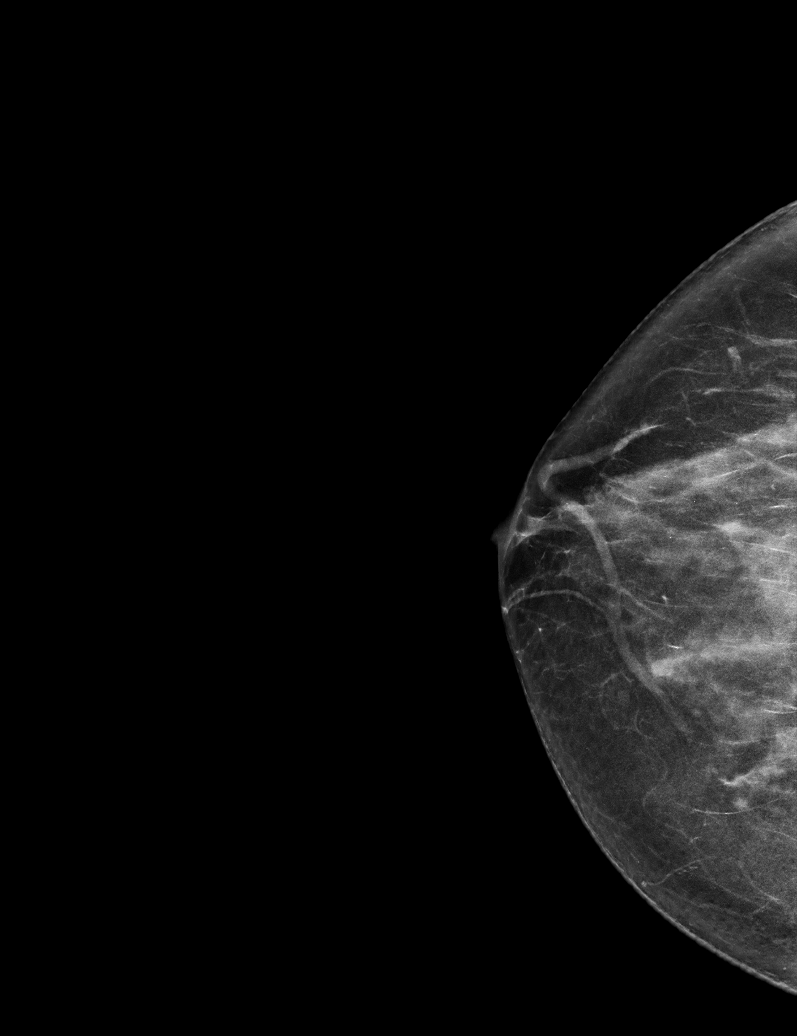

[L MLO synth-2D (1 of 2)]
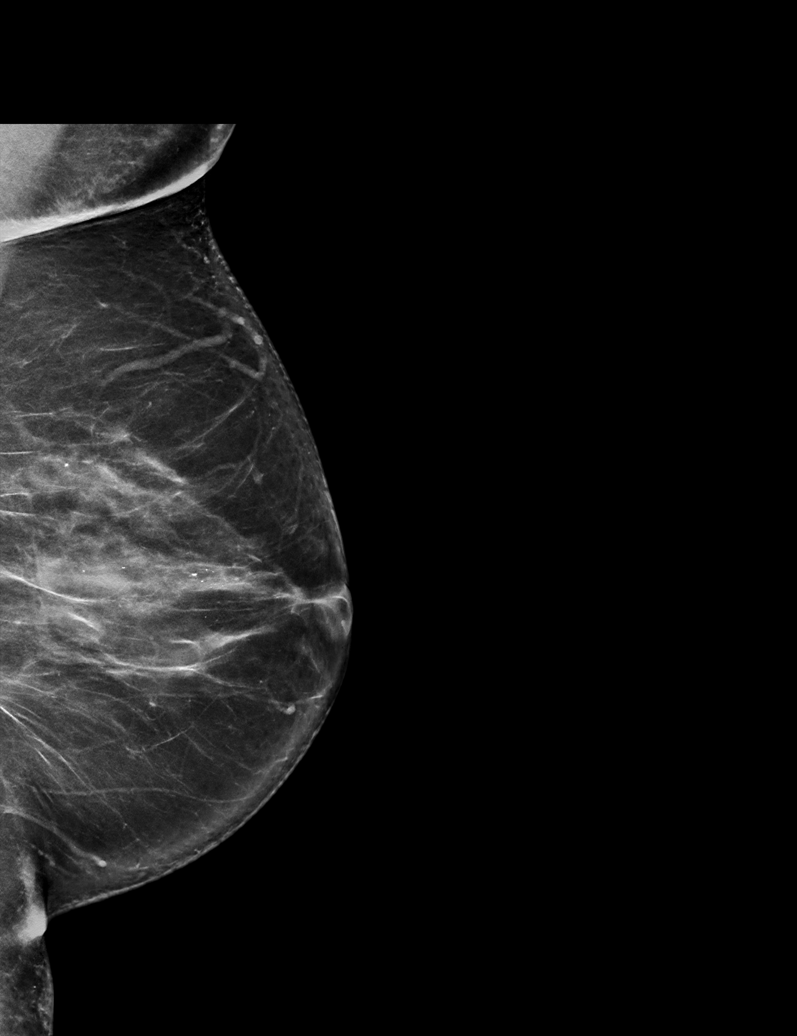

[L CC synth-2D]
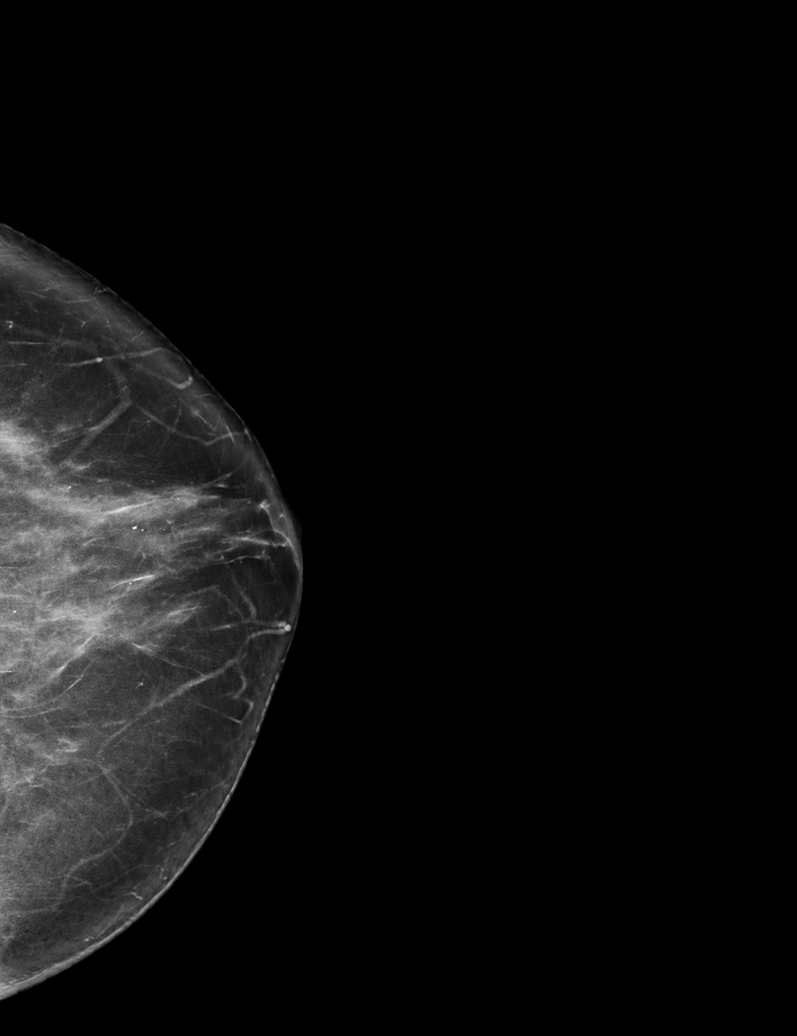

[R MLO synth-2D]
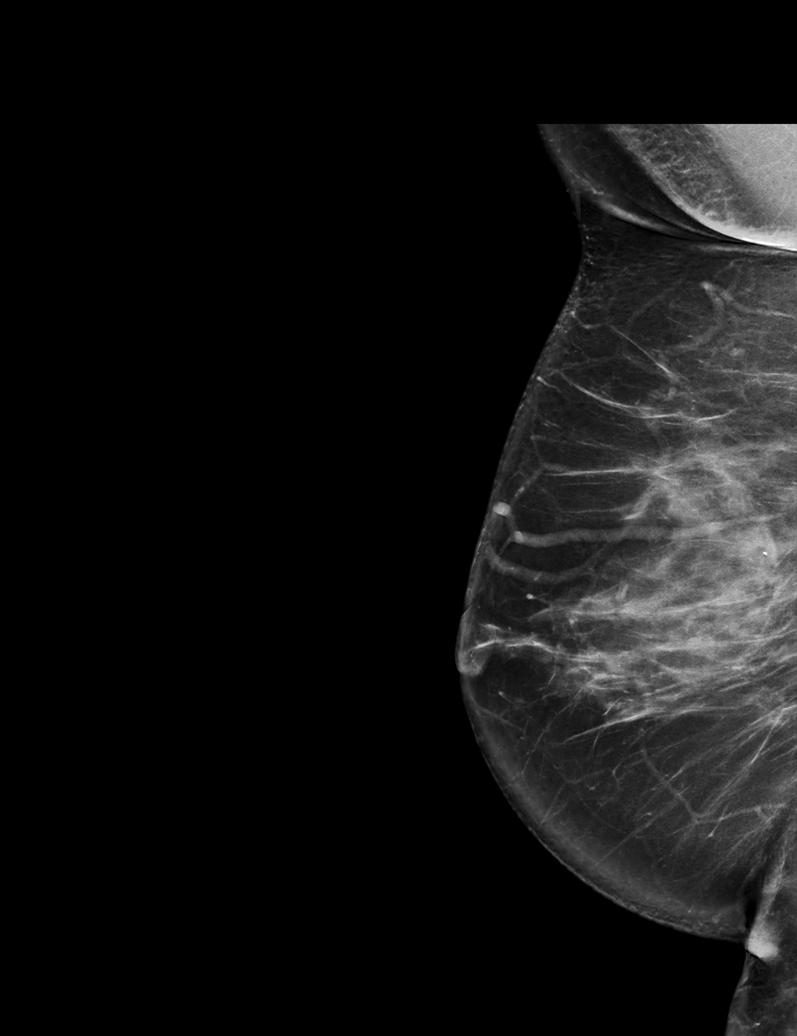

[L MLO synth-2D (2 of 2)]
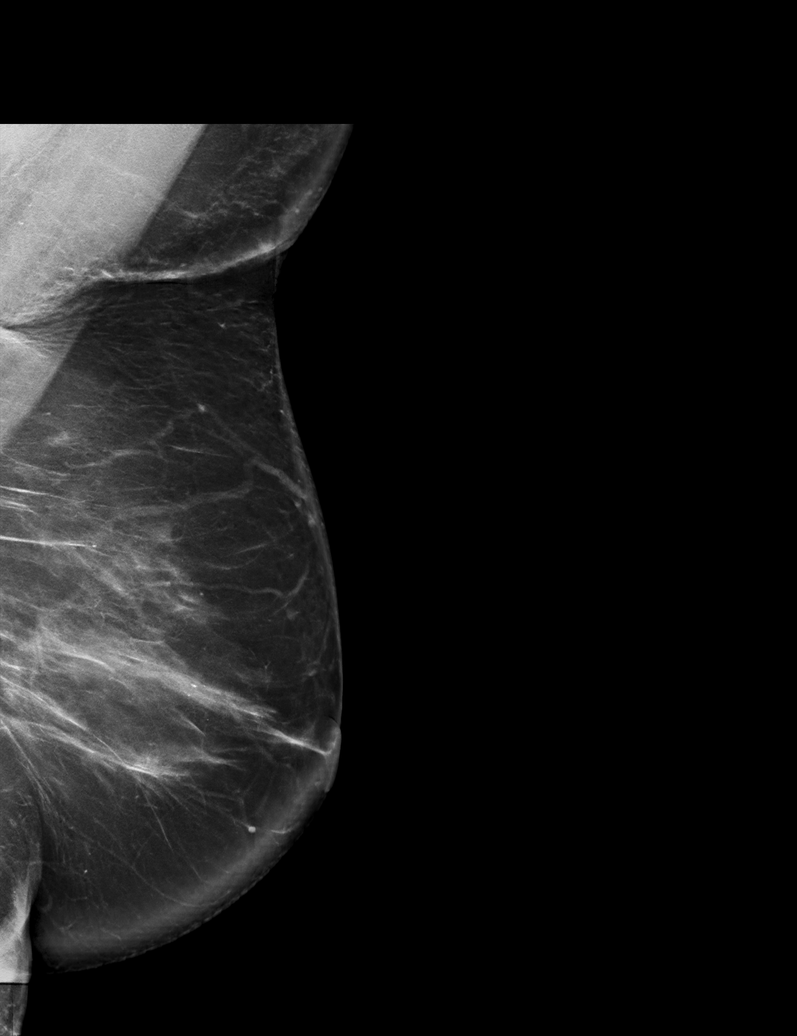

[R CC tomo · tomo slice 31/61.0]
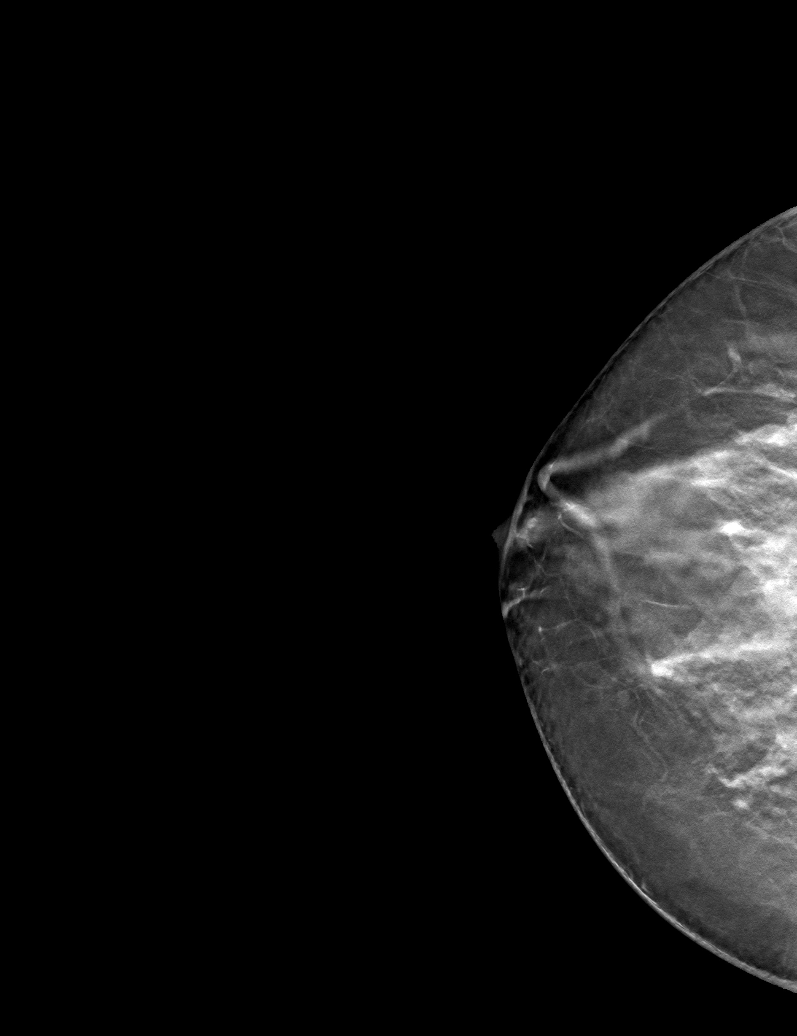

[6 of 30 positions shown; findings below may reference images not displayed]

ACR Breast Density Category c: The breast tissue is heterogeneously
dense, which may obscure small masses.
FINDINGS: Full field CC and MLO views of both breasts were obtained.

RIGHT: No findings suspicious for malignancy. No mammographic
abnormality in the LOWER INNER QUADRANT in the area of clinical
palpable concern.

Targeted ultrasound is performed in the LOWER INNER QUADRANT,
demonstrating normal scattered fibroglandular tissue. No cyst, solid
mass or abnormal acoustic shadowing is identified with imaging from
3 o'clock through 6 o'clock.

LEFT: No findings suspicious for malignancy. No mammographic
abnormality in the outer breast at the site of nonfocal pain. No
mammographic abnormality in the UPPER OUTER QUADRANT in the area of
clinical palpable concern.

Targeted ultrasound is performed in the UPPER OUTER QUADRANT,
demonstrating normal scattered fibroglandular tissue. No cyst, solid
mass or abnormal acoustic shadowing is identified with imaging from
12 o'clock through 4 o'clock.
IMPRESSION: No mammographic or sonographic evidence of malignancy involving
either breast.

RECOMMENDATION:
Screening mammogram in one year.(Code:XY-Z-ZTF)

I have discussed the findings and recommendations with the patient.
If applicable, a reminder letter will be sent to the patient
regarding the next appointment.

BI-RADS CATEGORY  1: Negative.

## 2023-10-29 ENCOUNTER — Telehealth: Payer: Self-pay

## 2023-10-29 NOTE — Telephone Encounter (Signed)
 Patient was identified as falling into the [place] Measure - Diabetes.   Patient was: Patient is not currently using our practice.

## 2024-01-12 DIAGNOSIS — I10 Essential (primary) hypertension: Secondary | ICD-10-CM | POA: Diagnosis not present

## 2024-01-12 DIAGNOSIS — E785 Hyperlipidemia, unspecified: Secondary | ICD-10-CM | POA: Diagnosis not present

## 2024-01-12 DIAGNOSIS — Z0184 Encounter for antibody response examination: Secondary | ICD-10-CM | POA: Diagnosis not present

## 2024-01-12 DIAGNOSIS — Z111 Encounter for screening for respiratory tuberculosis: Secondary | ICD-10-CM | POA: Diagnosis not present

## 2024-01-12 DIAGNOSIS — E1159 Type 2 diabetes mellitus with other circulatory complications: Secondary | ICD-10-CM | POA: Diagnosis not present

## 2024-03-23 DIAGNOSIS — E785 Hyperlipidemia, unspecified: Secondary | ICD-10-CM | POA: Diagnosis not present

## 2024-03-23 DIAGNOSIS — E1159 Type 2 diabetes mellitus with other circulatory complications: Secondary | ICD-10-CM | POA: Diagnosis not present
